# Patient Record
Sex: Female | Born: 1980
Health system: Southern US, Community
[De-identification: ages and names within clinical notes are randomized; demographics above are authoritative.]

## PROBLEM LIST (undated history)

## (undated) DIAGNOSIS — D649 Anemia, unspecified: Secondary | ICD-10-CM

## (undated) DIAGNOSIS — F329 Major depressive disorder, single episode, unspecified: Secondary | ICD-10-CM

## (undated) DIAGNOSIS — R87619 Unspecified abnormal cytological findings in specimens from cervix uteri: Secondary | ICD-10-CM

## (undated) DIAGNOSIS — F32A Depression, unspecified: Secondary | ICD-10-CM

## (undated) HISTORY — DX: Unspecified abnormal cytological findings in specimens from cervix uteri: R87.619

## (undated) HISTORY — PX: TONSILLECTOMY: SUR1361

## (undated) HISTORY — DX: Anemia, unspecified: D64.9

---
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
## 2012-07-10 HISTORY — PX: UMBILICAL HERNIA REPAIR: SHX196

## 2013-06-30 ENCOUNTER — Emergency Department (HOSPITAL_COMMUNITY)
Admission: EM | Admit: 2013-06-30 | Discharge: 2013-07-01 | Disposition: A | Discharge status: Home / Self Care | Payer: Medicare Other | Attending: Emergency Medicine | Admitting: Emergency Medicine

## 2013-06-30 ENCOUNTER — Encounter (HOSPITAL_COMMUNITY): Payer: Self-pay | Admitting: Emergency Medicine

## 2013-06-30 DIAGNOSIS — R109 Unspecified abdominal pain: Secondary | ICD-10-CM

## 2013-06-30 DIAGNOSIS — Z3202 Encounter for pregnancy test, result negative: Secondary | ICD-10-CM | POA: Insufficient documentation

## 2013-06-30 DIAGNOSIS — K420 Umbilical hernia with obstruction, without gangrene: Secondary | ICD-10-CM

## 2013-06-30 DIAGNOSIS — Z8659 Personal history of other mental and behavioral disorders: Secondary | ICD-10-CM | POA: Insufficient documentation

## 2013-06-30 DIAGNOSIS — Z87891 Personal history of nicotine dependence: Secondary | ICD-10-CM | POA: Insufficient documentation

## 2013-06-30 HISTORY — DX: Major depressive disorder, single episode, unspecified: F32.9

## 2013-06-30 HISTORY — DX: Depression, unspecified: F32.A

## 2013-06-30 LAB — CG4 I-STAT (LACTIC ACID): Lactic Acid, Venous: 0.76 mmol/L (ref 0.5–2.2)

## 2013-06-30 LAB — CBC
HEMATOCRIT: 38.3 % (ref 36.0–46.0)
Hemoglobin: 12.8 g/dL (ref 12.0–15.0)
MCH: 28.1 pg (ref 26.0–34.0)
MCHC: 33.4 g/dL (ref 30.0–36.0)
MCV: 84.2 fL (ref 78.0–100.0)
Platelets: 474 10*3/uL — ABNORMAL HIGH (ref 150–400)
RBC: 4.55 MIL/uL (ref 3.87–5.11)
RDW: 14 % (ref 11.5–15.5)
WBC: 9.2 10*3/uL (ref 4.0–10.5)

## 2013-06-30 MED ORDER — ONDANSETRON HCL 4 MG/2ML IJ SOLN
4.0000 mg | Freq: Once | INTRAMUSCULAR | Status: AC
  Administered 2013-06-30: 4 mg via INTRAVENOUS
  Filled 2013-06-30: qty 2

## 2013-06-30 MED ORDER — MORPHINE SULFATE 4 MG/ML IJ SOLN
4.0000 mg | Freq: Once | INTRAMUSCULAR | Status: AC
  Administered 2013-06-30: 4 mg via INTRAVENOUS
  Filled 2013-06-30: qty 1

## 2013-06-30 NOTE — ED Provider Notes (Signed)
CSN: [id number]     Arrival date & time 06/30/13  2052 History   First MD Initiated Contact with Patient 06/30/13 2258     Chief Complaint  Patient presents with  . Abdominal Pain   (Consider location/radiation/quality/duration/timing/severity/associated sxs/prior Treatment) HPI Patient is a generally healthy 33 yo woman with a history of an umbilical hernia. She presents with complaints of inability to reduce her umbilical hernia and periumbilical abdominal pain for the past 4 days. Pain has been moderately severe. It is worse when the patient bends at the waist. She denies N/V, fever. She has had normal BMs - last this morning. PO intake and appetite have been wnl.  Patient notes that the pain radiates to the right side of the abdominal. She notes that she has experienced similar pain in the past but not pain which has extended to the right abdomen. Previous episodes of abdominal pain have been less severe and shorter lived - < 24 h.   Past Medical History  Diagnosis Date  . Depression    Past Surgical History  Procedure Laterality Date  . Tonsillectomy     No family history on file. History  Substance Use Topics  . Smoking status: Former Smoker    Quit date: 06/16/2013  . Smokeless tobacco: Not on file  . Alcohol Use: Yes     Comment: occ   OB History   Grav Para Term Preterm Abortions TAB SAB Ect Mult Living                 Review of Systems Ten point review of symptoms performed and is negative with the exception of symptoms noted above.   Allergies  Review of patient's allergies indicates no known allergies.  Home Medications  No current outpatient prescriptions on file. BP 156/88  Pulse 99  Temp(Src) 97.9 F (36.6 C) (Oral)  Resp 18  Ht 5\' 6"  (1.676 m)  Wt 258 lb 8 oz (117.255 kg)  BMI 41.74 kg/m2  SpO2 100% Physical Exam Gen: well developed and well nourished appearing Head: NCAT Eyes: PERL, EOMI Nose: no epistaixis or rhinorrhea Mouth/throat: mucosa  is moist and pink Neck: supple, no stridor Lungs: CTA B, no wheezing, rhonchi or rales CV: RRR, no murmur, extremities appear well perfused.  Abd: soft, obese, umbilical hernia which extends to the right abdomen is tender and I am not able to reduce, the abdomen is otherwise notender, nondistended Back: no ttp, no cva ttp Skin: warm and dry Ext: normal to inspection, no dependent edema Neuro: CN ii-xii grossly intact, no focal deficits Psyche; normal affect,  calm and cooperative.   ED Course  Procedures (including critical care time)  Results for orders placed during the hospital encounter of 06/30/13 (from the past 24 hour(s))  CBC     Status: Abnormal   Collection Time    06/30/13 11:31 PM      Result Value Range   WBC 9.2  4.0 - 10.5 K/uL   RBC 4.55  3.87 - 5.11 MIL/uL   Hemoglobin 12.8  12.0 - 15.0 g/dL   HCT 40.938.3  81.136.0 - 91.446.0 %   MCV 84.2  78.0 - 100.0 fL   MCH 28.1  26.0 - 34.0 pg   MCHC 33.4  30.0 - 36.0 g/dL   RDW 78.214.0  95.611.5 - 21.315.5 %   Platelets 474 (*) 150 - 400 K/uL  BASIC METABOLIC PANEL     Status: Abnormal   Collection Time    06/30/13 11:31  PM      Result Value Range   Sodium 138  137 - 147 mEq/L   Potassium 4.1  3.7 - 5.3 mEq/L   Chloride 102  96 - 112 mEq/L   CO2 24  19 - 32 mEq/L   Glucose, Bld 82  70 - 99 mg/dL   BUN 7  6 - 23 mg/dL   Creatinine, Ser 8.11  0.50 - 1.10 mg/dL   Calcium 9.5  8.4 - 91.4 mg/dL   GFR calc non Af Amer 76 (*) >90 mL/min   GFR calc Af Amer 89 (*) >90 mL/min  URINALYSIS, ROUTINE W REFLEX MICROSCOPIC     Status: Abnormal   Collection Time    06/30/13 11:41 PM      Result Value Range   Color, Urine YELLOW  YELLOW   APPearance CLOUDY (*) CLEAR   Specific Gravity, Urine 1.020  1.005 - 1.030   pH 8.0  5.0 - 8.0   Glucose, UA NEGATIVE  NEGATIVE mg/dL   Hgb urine dipstick NEGATIVE  NEGATIVE   Bilirubin Urine NEGATIVE  NEGATIVE   Ketones, ur NEGATIVE  NEGATIVE mg/dL   Protein, ur NEGATIVE  NEGATIVE mg/dL   Urobilinogen, UA 0.2   0.0 - 1.0 mg/dL   Nitrite NEGATIVE  NEGATIVE   Leukocytes, UA TRACE (*) NEGATIVE  URINE MICROSCOPIC-ADD ON     Status: Abnormal   Collection Time    06/30/13 11:41 PM      Result Value Range   Squamous Epithelial / LPF FEW (*) RARE   WBC, UA 0-2  <3 WBC/hpf   Bacteria, UA FEW (*) RARE  PREGNANCY, URINE     Status: None   Collection Time    06/30/13 11:41 PM      Result Value Range   Preg Test, Ur NEGATIVE  NEGATIVE  CG4 I-STAT (LACTIC ACID)     Status: None   Collection Time    06/30/13 11:44 PM      Result Value Range   Lactic Acid, Venous 0.76  0.5 - 2.2 mmol/L     MDM  The patient appears to have an incarcerated umblical hernia with omentum more likely than bowel. No signs of sx of obstruction. Labs are reassuring with normal WBC. CT scan for further evaluation. Will attempt to reduce with analgesia.    0210: PROCEDURE NOTE:  HERNIA REDUCTION - umblical hernia successful reduced by me with dilaudid 1mg  IV as analgesia and no sedation. Patient says sx completely resolved with reduction. She is now stable for d/c with plan to f/u with [facility] to be evaluated for elective hernia repair.     Brandt Loosen, MD 07/01/13 [phone]

## 2013-06-30 NOTE — ED Notes (Signed)
Pt states she has had an umbilical hernia for some time, and she and her PCP has been waiting to see if it got better.  Pain for the past four days has been increased

## 2013-07-01 ENCOUNTER — Emergency Department (HOSPITAL_COMMUNITY): Payer: Medicare Other

## 2013-07-01 ENCOUNTER — Encounter (HOSPITAL_COMMUNITY): Payer: Self-pay | Admitting: Radiology

## 2013-07-01 LAB — URINALYSIS, ROUTINE W REFLEX MICROSCOPIC
Bilirubin Urine: NEGATIVE
Glucose, UA: NEGATIVE mg/dL
Hgb urine dipstick: NEGATIVE
KETONES UR: NEGATIVE mg/dL
NITRITE: NEGATIVE
PROTEIN: NEGATIVE mg/dL
Specific Gravity, Urine: 1.02 (ref 1.005–1.030)
Urobilinogen, UA: 0.2 mg/dL (ref 0.0–1.0)
pH: 8 (ref 5.0–8.0)

## 2013-07-01 LAB — BASIC METABOLIC PANEL
BUN: 7 mg/dL (ref 6–23)
CO2: 24 mEq/L (ref 19–32)
Calcium: 9.5 mg/dL (ref 8.4–10.5)
Chloride: 102 mEq/L (ref 96–112)
Creatinine, Ser: 0.97 mg/dL (ref 0.50–1.10)
GFR calc Af Amer: 89 mL/min — ABNORMAL LOW (ref >90)
GFR calc non Af Amer: 76 mL/min — ABNORMAL LOW (ref >90)
Glucose, Bld: 82 mg/dL (ref 70–99)
Potassium: 4.1 mEq/L (ref 3.7–5.3)
SODIUM: 138 meq/L (ref 137–147)

## 2013-07-01 LAB — PREGNANCY, URINE: Preg Test, Ur: NEGATIVE

## 2013-07-01 LAB — URINE MICROSCOPIC-ADD ON

## 2013-07-01 MED ORDER — HYDROMORPHONE HCL PF 1 MG/ML IJ SOLN
1.0000 mg | Freq: Once | INTRAMUSCULAR | Status: AC
  Administered 2013-07-01: 1 mg via INTRAVENOUS
  Filled 2013-07-01: qty 1

## 2013-07-01 MED ORDER — IOHEXOL 300 MG/ML  SOLN
100.0000 mL | Freq: Once | INTRAMUSCULAR | Status: AC | PRN
  Administered 2013-07-01: 100 mL via INTRAVENOUS

## 2014-12-18 ENCOUNTER — Emergency Department (HOSPITAL_COMMUNITY)
Admission: EM | Admit: 2014-12-18 | Discharge: 2014-12-18 | Disposition: A | Discharge status: Home / Self Care | Payer: Medicare Other | Attending: Emergency Medicine | Admitting: Emergency Medicine

## 2014-12-18 ENCOUNTER — Emergency Department (HOSPITAL_COMMUNITY): Payer: Medicare Other

## 2014-12-18 ENCOUNTER — Encounter (HOSPITAL_COMMUNITY): Payer: Self-pay | Admitting: Nurse Practitioner

## 2014-12-18 DIAGNOSIS — Z3A01 Less than 8 weeks gestation of pregnancy: Secondary | ICD-10-CM | POA: Insufficient documentation

## 2014-12-18 DIAGNOSIS — O209 Hemorrhage in early pregnancy, unspecified: Secondary | ICD-10-CM | POA: Insufficient documentation

## 2014-12-18 DIAGNOSIS — R5383 Other fatigue: Secondary | ICD-10-CM | POA: Insufficient documentation

## 2014-12-18 DIAGNOSIS — O9A211 Injury, poisoning and certain other consequences of external causes complicating pregnancy, first trimester: Secondary | ICD-10-CM | POA: Insufficient documentation

## 2014-12-18 DIAGNOSIS — O210 Mild hyperemesis gravidarum: Secondary | ICD-10-CM | POA: Diagnosis not present

## 2014-12-18 DIAGNOSIS — Z87891 Personal history of nicotine dependence: Secondary | ICD-10-CM | POA: Diagnosis not present

## 2014-12-18 DIAGNOSIS — Y998 Other external cause status: Secondary | ICD-10-CM | POA: Insufficient documentation

## 2014-12-18 DIAGNOSIS — Y9389 Activity, other specified: Secondary | ICD-10-CM | POA: Diagnosis not present

## 2014-12-18 DIAGNOSIS — S3991XA Unspecified injury of abdomen, initial encounter: Secondary | ICD-10-CM | POA: Diagnosis not present

## 2014-12-18 DIAGNOSIS — O9989 Other specified diseases and conditions complicating pregnancy, childbirth and the puerperium: Secondary | ICD-10-CM | POA: Diagnosis present

## 2014-12-18 DIAGNOSIS — Y9289 Other specified places as the place of occurrence of the external cause: Secondary | ICD-10-CM | POA: Diagnosis not present

## 2014-12-18 DIAGNOSIS — R102 Pelvic and perineal pain: Secondary | ICD-10-CM

## 2014-12-18 DIAGNOSIS — Z8659 Personal history of other mental and behavioral disorders: Secondary | ICD-10-CM | POA: Diagnosis not present

## 2014-12-18 DIAGNOSIS — Z3490 Encounter for supervision of normal pregnancy, unspecified, unspecified trimester: Secondary | ICD-10-CM

## 2014-12-18 LAB — COMPREHENSIVE METABOLIC PANEL
ALT: 19 U/L (ref 14–54)
ANION GAP: 8 (ref 5–15)
AST: 17 U/L (ref 15–41)
Albumin: 3.5 g/dL (ref 3.5–5.0)
Alkaline Phosphatase: 52 U/L (ref 38–126)
BILIRUBIN TOTAL: 0.2 mg/dL — AB (ref 0.3–1.2)
BUN: 7 mg/dL (ref 6–20)
CO2: 22 mmol/L (ref 22–32)
CREATININE: 0.85 mg/dL (ref 0.44–1.00)
Calcium: 9.2 mg/dL (ref 8.9–10.3)
Chloride: 105 mmol/L (ref 101–111)
GFR calc non Af Amer: >60 mL/min (ref >60)
GLUCOSE: 89 mg/dL (ref 65–99)
Potassium: 4 mmol/L (ref 3.5–5.1)
SODIUM: 135 mmol/L (ref 135–145)
Total Protein: 7.3 g/dL (ref 6.5–8.1)

## 2014-12-18 LAB — URINALYSIS, ROUTINE W REFLEX MICROSCOPIC
Bilirubin Urine: NEGATIVE
Glucose, UA: NEGATIVE mg/dL
HGB URINE DIPSTICK: NEGATIVE
KETONES UR: NEGATIVE mg/dL
LEUKOCYTES UA: NEGATIVE
NITRITE: NEGATIVE
PH: 6.5 (ref 5.0–8.0)
PROTEIN: NEGATIVE mg/dL
Specific Gravity, Urine: 1.022 (ref 1.005–1.030)
UROBILINOGEN UA: 1 mg/dL (ref 0.0–1.0)

## 2014-12-18 LAB — I-STAT BETA HCG BLOOD, ED (MC, WL, AP ONLY)

## 2014-12-18 LAB — LIPASE, BLOOD: Lipase: 18 U/L — ABNORMAL LOW (ref 22–51)

## 2014-12-18 LAB — WET PREP, GENITAL
Clue Cells Wet Prep HPF POC: NONE SEEN
Trich, Wet Prep: NONE SEEN
Yeast Wet Prep HPF POC: NONE SEEN

## 2014-12-18 LAB — CBC
HCT: 38.2 % (ref 36.0–46.0)
Hemoglobin: 12.8 g/dL (ref 12.0–15.0)
MCH: 27.8 pg (ref 26.0–34.0)
MCHC: 33.5 g/dL (ref 30.0–36.0)
MCV: 83 fL (ref 78.0–100.0)
Platelets: 384 10*3/uL (ref 150–400)
RBC: 4.6 MIL/uL (ref 3.87–5.11)
RDW: 14.8 % (ref 11.5–15.5)
WBC: 9 10*3/uL (ref 4.0–10.5)

## 2014-12-18 LAB — HCG, QUANTITATIVE, PREGNANCY: hCG, Beta Chain, Quant, S: 53185 m[IU]/mL — ABNORMAL HIGH (ref <5)

## 2014-12-18 MED ORDER — METOCLOPRAMIDE HCL 10 MG PO TABS: 10.0000 mg | ORAL_TABLET | Freq: Four times a day (QID) | ORAL | Status: DC

## 2014-12-18 MED ORDER — PRENATAL COMPLETE 14-0.4 MG PO TABS: ORAL_TABLET | ORAL | Status: DC
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
## 2014-12-18 NOTE — Discharge Instructions (Signed)
Abdominal Pain During Pregnancy °Abdominal pain is common in pregnancy. Most of the time, it does not cause harm. There are many causes of abdominal pain. Some causes are more serious than others. Some of the causes of abdominal pain in pregnancy are easily diagnosed. Occasionally, the diagnosis takes time to understand. Other times, the cause is not determined. Abdominal pain can be a sign that something is very wrong with the pregnancy, or the pain may have nothing to do with the pregnancy at all. For this reason, always tell your health care provider if you have any abdominal discomfort. °HOME CARE INSTRUCTIONS  °Monitor your abdominal pain for any changes. The following actions may help to alleviate any discomfort you are experiencing: °· Do not have sexual intercourse or put anything in your vagina until your symptoms go away completely. °· Get plenty of rest until your pain improves. °· Drink clear fluids if you feel nauseous. Avoid solid food as long as you are uncomfortable or nauseous. °· Only take over-the-counter or prescription medicine as directed by your health care provider. °· Keep all follow-up appointments with your health care provider. °SEEK IMMEDIATE MEDICAL CARE IF: °· You are bleeding, leaking fluid, or passing tissue from the vagina. °· You have increasing pain or cramping. °· You have persistent vomiting. °· You have painful or bloody urination. °· You have a fever. °· You notice a decrease in your baby's movements. °· You have extreme weakness or feel faint. °· You have shortness of breath, with or without abdominal pain. °· You develop a severe headache with abdominal pain. °· You have abnormal vaginal discharge with abdominal pain. °· You have persistent diarrhea. °· You have abdominal pain that continues even after rest, or gets worse. °MAKE SURE YOU:  °· Understand these instructions. °· Will watch your condition. °· Will get help right away if you are not doing well or get  worse. °Document Released: 05/26/2005 Document Revised: 03/16/2013 Document Reviewed: 12/23/2012 °Exit[employer]® Patient Information ©2015 [employer]. This information is not intended to replace advice given to you by your health care provider. Make sure you discuss any questions you have with your health care provider. °First Trimester of Pregnancy °The first trimester of pregnancy is from week 1 until the end of week 12 (months 1 through 3). A week after a sperm fertilizes an egg, the egg will implant on the wall of the uterus. This embryo will begin to develop into a baby. Genes from you and your partner are forming the baby. The female genes determine whether the baby is a boy or a girl. At 6-8 weeks, the eyes and face are formed, and the heartbeat can be seen on ultrasound. At the end of 12 weeks, all the baby's organs are formed.  °Now that you are pregnant, you will want to do everything you can to have a healthy baby. Two of the most important things are to get good prenatal care and to follow your health care provider's instructions. Prenatal care is all the medical care you receive before the baby's birth. This care will help prevent, find, and treat any problems during the pregnancy and childbirth. °BODY CHANGES °Your body goes through many changes during pregnancy. The changes vary from woman to woman.  °· You may gain or lose a couple of pounds at first. °· You may feel sick to your stomach (nauseous) and throw up (vomit). If the vomiting is uncontrollable, call your health care provider. °· You may tire easily. °·   You may develop headaches that can be relieved by medicines approved by your health care provider. °· You may urinate more often. Painful urination may mean you have a bladder infection. °· You may develop heartburn as a result of your pregnancy. °· You may develop constipation because certain hormones are causing the muscles that push waste through your intestines to slow down. °· You may  develop hemorrhoids or swollen, bulging veins (varicose veins). °· Your breasts may begin to grow larger and become tender. Your nipples may stick out more, and the tissue that surrounds them (areola) may become darker. °· Your gums may bleed and may be sensitive to brushing and flossing. °· Dark spots or blotches (chloasma, mask of pregnancy) may develop on your face. This will likely fade after the baby is born. °· Your menstrual periods will stop. °· You may have a loss of appetite. °· You may develop cravings for certain kinds of food. °· You may have changes in your emotions from day to day, such as being excited to be pregnant or being concerned that something may go wrong with the pregnancy and baby. °· You may have more vivid and strange dreams. °· You may have changes in your hair. These can include thickening of your hair, rapid growth, and changes in texture. Some women also have hair loss during or after pregnancy, or hair that feels dry or thin. Your hair will most likely return to normal after your baby is born. °WHAT TO EXPECT AT YOUR PRENATAL VISITS °During a routine prenatal visit: °· You will be weighed to make sure you and the baby are growing normally. °· Your blood pressure will be taken. °· Your abdomen will be measured to track your baby's growth. °· The fetal heartbeat will be listened to starting around week 10 or 12 of your pregnancy. °· Test results from any previous visits will be discussed. °Your health care provider may ask you: °· How you are feeling. °· If you are feeling the baby move. °· If you have had any abnormal symptoms, such as leaking fluid, bleeding, severe headaches, or abdominal cramping. °· If you have any questions. °Other tests that may be performed during your first trimester include: °· Blood tests to find your blood type and to check for the presence of any previous infections. They will also be used to check for low iron levels (anemia) and Rh antibodies. Later in  the pregnancy, blood tests for diabetes will be done along with other tests if problems develop. °· Urine tests to check for infections, diabetes, or protein in the urine. °· An ultrasound to confirm the proper growth and development of the baby. °· An amniocentesis to check for possible genetic problems. °· Fetal screens for spina bifida and Down syndrome. °· You may need other tests to make sure you and the baby are doing well. °HOME CARE INSTRUCTIONS  °Medicines °· Follow your health care provider's instructions regarding medicine use. Specific medicines may be either safe or unsafe to take during pregnancy. °· Take your prenatal vitamins as directed. °· If you develop constipation, try taking a stool softener if your health care provider approves. °Diet °· Eat regular, well-balanced meals. Choose a variety of foods, such as meat or vegetable-based protein, fish, milk and low-fat dairy products, vegetables, fruits, and whole grain breads and cereals. Your health care provider will help you determine the amount of weight gain that is right for you. °· Avoid raw meat and uncooked cheese. These   carry germs that can cause birth defects in the baby. °· Eating four or five small meals rather than three large meals a day may help relieve nausea and vomiting. If you start to feel nauseous, eating a few soda crackers can be helpful. Drinking liquids between meals instead of during meals also seems to help nausea and vomiting. °· If you develop constipation, eat more high-fiber foods, such as fresh vegetables or fruit and whole grains. Drink enough fluids to keep your urine clear or pale yellow. °Activity and Exercise °· Exercise only as directed by your health care provider. Exercising will help you: °¨ Control your weight. °¨ Stay in shape. °¨ Be prepared for labor and delivery. °· Experiencing pain or cramping in the lower abdomen or low back is a good sign that you should stop exercising. Check with your health care  provider before continuing normal exercises. °· Try to avoid standing for long periods of time. Move your legs often if you must stand in one place for a long time. °· Avoid heavy lifting. °· Wear low-heeled shoes, and practice good posture. °· You may continue to have sex unless your health care provider directs you otherwise. °Relief of Pain or Discomfort °· Wear a good support bra for breast tenderness.   °· Take warm sitz baths to soothe any pain or discomfort caused by hemorrhoids. Use hemorrhoid cream if your health care provider approves.   °· Rest with your legs elevated if you have leg cramps or low back pain. °· If you develop varicose veins in your legs, wear support hose. Elevate your feet for 15 minutes, 3-4 times a day. Limit salt in your diet. °Prenatal Care °· Schedule your prenatal visits by the twelfth week of pregnancy. They are usually scheduled monthly at first, then more often in the last 2 months before delivery. °· Write down your questions. Take them to your prenatal visits. °· Keep all your prenatal visits as directed by your health care provider. °Safety °· Wear your seat belt at all times when driving. °· Make a list of emergency phone numbers, including numbers for family, friends, the hospital, and police and fire departments. °General Tips °· Ask your health care provider for a referral to a local prenatal education class. Begin classes no later than at the beginning of month 6 of your pregnancy. °· Ask for help if you have counseling or nutritional needs during pregnancy. Your health care provider can offer advice or refer you to specialists for help with various needs. °· Do not use hot tubs, steam rooms, or saunas. °· Do not douche or use tampons or scented sanitary pads. °· Do not cross your legs for long periods of time. °· Avoid cat litter boxes and soil used by cats. These carry germs that can cause birth defects in the baby and possibly loss of the fetus by miscarriage or  stillbirth. °· Avoid all smoking, herbs, alcohol, and medicines not prescribed by your health care provider. Chemicals in these affect the formation and growth of the baby. °· Schedule a dentist appointment. At home, brush your teeth with a soft toothbrush and be gentle when you floss. °SEEK MEDICAL CARE IF:  °· You have dizziness. °· You have mild pelvic cramps, pelvic pressure, or nagging pain in the abdominal area. °· You have persistent nausea, vomiting, or diarrhea. °· You have a bad smelling vaginal discharge. °· You have pain with urination. °· You notice increased swelling in your face, hands, legs, or ankles. °SEEK   IMMEDIATE MEDICAL CARE IF:  °· You have a fever. °· You are leaking fluid from your vagina. °· You have spotting or bleeding from your vagina. °· You have severe abdominal cramping or pain. °· You have rapid weight gain or loss. °· You vomit blood or material that looks like coffee grounds. °· You are exposed to German measles and have never had them. °· You are exposed to fifth disease or chickenpox. °· You develop a severe headache. °· You have shortness of breath. °· You have any kind of trauma, such as from a fall or a car accident. °Document Released: 05/20/2001 Document Revised: 10/10/2013 Document Reviewed: 04/05/2013 °ExitCare® Patient Information ©2015 [employer]. This information is not intended to replace advice given to you by your health care provider. Make sure you discuss any questions you have with your health care provider. ° °
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
## 2014-12-19 LAB — GC/CHLAMYDIA PROBE AMP (~~LOC~~) NOT AT ARMC
Chlamydia: NEGATIVE
Neisseria Gonorrhea: NEGATIVE

## 2015-01-04 ENCOUNTER — Ambulatory Visit: Payer: Medicaid Other | Admitting: Obstetrics and Gynecology

## 2015-01-16 ENCOUNTER — Ambulatory Visit (INDEPENDENT_AMBULATORY_CARE_PROVIDER_SITE_OTHER): Payer: Medicare Other

## 2015-01-16 VITALS — BP 117/87 | HR 73 | Wt 245.6 lb

## 2015-01-16 DIAGNOSIS — Z36 Encounter for antenatal screening of mother: Secondary | ICD-10-CM

## 2015-01-16 DIAGNOSIS — Z1389 Encounter for screening for other disorder: Secondary | ICD-10-CM

## 2015-01-16 DIAGNOSIS — Z113 Encounter for screening for infections with a predominantly sexual mode of transmission: Secondary | ICD-10-CM

## 2015-01-16 DIAGNOSIS — Z369 Encounter for antenatal screening, unspecified: Secondary | ICD-10-CM

## 2015-01-16 DIAGNOSIS — R638 Other symptoms and signs concerning food and fluid intake: Secondary | ICD-10-CM

## 2015-01-16 DIAGNOSIS — Z331 Pregnant state, incidental: Secondary | ICD-10-CM

## 2015-01-16 MED ORDER — DOXYLAMINE-PYRIDOXINE 10-10 MG PO TBEC: DELAYED_RELEASE_TABLET | ORAL | Status: DC

## 2015-01-16 NOTE — Progress Notes (Signed)
Lori Hahn for NOB nurse interview visit. G-8.   [id number].  Confirmed at [facility]. Ultrsound done on 12/18/2014.  Pregnancy eduction material explained and given. No cats in the home. NOB labs ordered. TSH/HbgA1c due to Increased BMI, HIV consent form signed. Drug screen ordered and sent to lab. PNV encouraged. NT ordered. Pt. To follow up with provider in 2 weeks for NOB physical.  All questions answered.  ZIKA EXPOSURE SCREEN:  The patient has not traveled to a [place] Virus endemic area within the past 6 months, nor has she had unprotected sex with a partner who has travelled to a [place] endemic region within the past 6 months. The patient has been advised to notify us if these factors change any time during this current pregnancy, so adequate testing and monitoring can be initiated.

## 2015-01-17 ENCOUNTER — Other Ambulatory Visit: Payer: Self-pay | Admitting: Obstetrics and Gynecology

## 2015-01-17 DIAGNOSIS — O9989 Other specified diseases and conditions complicating pregnancy, childbirth and the puerperium: Principal | ICD-10-CM

## 2015-01-17 DIAGNOSIS — O09899 Supervision of other high risk pregnancies, unspecified trimester: Secondary | ICD-10-CM

## 2015-01-17 DIAGNOSIS — Z283 Underimmunization status: Secondary | ICD-10-CM

## 2015-01-17 DIAGNOSIS — Z2839 Other underimmunization status: Secondary | ICD-10-CM

## 2015-01-17 LAB — CBC WITH DIFFERENTIAL/PLATELET
BASOS ABS: 0 10*3/uL (ref 0.0–0.2)
Basos: 0 %
EOS (ABSOLUTE): 0.1 10*3/uL (ref 0.0–0.4)
Eos: 1 %
Hematocrit: 40.5 % (ref 34.0–46.6)
Hemoglobin: 13.6 g/dL (ref 11.1–15.9)
IMMATURE GRANULOCYTES: 0 %
Immature Grans (Abs): 0 10*3/uL (ref 0.0–0.1)
Lymphocytes Absolute: 2.9 10*3/uL (ref 0.7–3.1)
Lymphs: 34 %
MCH: 27.8 pg (ref 26.6–33.0)
MCHC: 33.6 g/dL (ref 31.5–35.7)
MCV: 83 fL (ref 79–97)
Monocytes Absolute: 0.6 10*3/uL (ref 0.1–0.9)
Monocytes: 7 %
NEUTROS PCT: 58 %
Neutrophils Absolute: 5 10*3/uL (ref 1.4–7.0)
PLATELETS: 454 10*3/uL — AB (ref 150–379)
RBC: 4.9 x10E6/uL (ref 3.77–5.28)
RDW: 14.9 % (ref 12.3–15.4)
WBC: 8.6 10*3/uL (ref 3.4–10.8)

## 2015-01-17 LAB — URINALYSIS, ROUTINE W REFLEX MICROSCOPIC
BILIRUBIN UA: NEGATIVE
Glucose, UA: NEGATIVE
Ketones, UA: NEGATIVE
Leukocytes, UA: NEGATIVE
NITRITE UA: NEGATIVE
PH UA: 6 (ref 5.0–7.5)
Protein, UA: NEGATIVE
RBC, UA: NEGATIVE
SPEC GRAV UA: 1.022 (ref 1.005–1.030)
UUROB: 0.2 mg/dL (ref 0.2–1.0)

## 2015-01-17 LAB — VARICELLA ZOSTER ANTIBODY, IGM: Varicella IgM: <0.91 index (ref 0.00–0.90)

## 2015-01-17 LAB — RH TYPE: Rh Factor: POSITIVE

## 2015-01-17 LAB — RUBELLA ANTIBODY, IGM: Rubella IgM: <20 AU/mL (ref 0.0–19.9)

## 2015-01-17 LAB — HEMOGLOBIN A1C
ESTIMATED AVERAGE GLUCOSE: 114 mg/dL
HEMOGLOBIN A1C: 5.6 % (ref 4.8–5.6)

## 2015-01-17 LAB — SICKLE CELL SCREEN: SICKLE CELL SCREEN: NEGATIVE

## 2015-01-17 LAB — TSH: TSH: 1.32 u[IU]/mL (ref 0.450–4.500)

## 2015-01-17 LAB — HIV ANTIBODY (ROUTINE TESTING W REFLEX): HIV Screen 4th Generation wRfx: NONREACTIVE

## 2015-01-17 LAB — RPR: RPR: NONREACTIVE

## 2015-01-17 LAB — ANTIBODY SCREEN: Antibody Screen: NEGATIVE

## 2015-01-17 LAB — ABO

## 2015-01-17 LAB — HEPATITIS B SURFACE ANTIGEN: Hepatitis B Surface Ag: NEGATIVE

## 2015-01-18 LAB — GC/CHLAMYDIA PROBE AMP
Chlamydia trachomatis, NAA: NEGATIVE
Neisseria gonorrhoeae by PCR: NEGATIVE

## 2015-01-18 LAB — URINE CULTURE: Organism ID, Bacteria: NO GROWTH

## 2015-01-24 ENCOUNTER — Other Ambulatory Visit: Payer: Self-pay | Admitting: Obstetrics and Gynecology

## 2015-01-24 ENCOUNTER — Other Ambulatory Visit: Payer: Medicare Other

## 2015-01-24 DIAGNOSIS — Z369 Encounter for antenatal screening, unspecified: Secondary | ICD-10-CM

## 2015-01-24 DIAGNOSIS — Z36 Encounter for antenatal screening of mother: Secondary | ICD-10-CM | POA: Diagnosis not present

## 2015-01-24 DIAGNOSIS — Z331 Pregnant state, incidental: Secondary | ICD-10-CM | POA: Diagnosis not present

## 2015-01-25 ENCOUNTER — Other Ambulatory Visit: Payer: Self-pay | Admitting: Obstetrics and Gynecology

## 2015-01-25 ENCOUNTER — Encounter: Payer: Self-pay | Admitting: Obstetrics and Gynecology

## 2015-01-25 DIAGNOSIS — F1290 Cannabis use, unspecified, uncomplicated: Secondary | ICD-10-CM | POA: Insufficient documentation

## 2015-01-26 LAB — FIRST TRIMESTER SCREEN W/NT
CRL: 66.1 mm
DIA MoM: 1.53
DIA VALUE: 266.3 pg/mL
GEST AGE-COLLECT: 12.7 wk
HCG MOM: 1.89
HCG VALUE: 126.5 [IU]/mL
Maternal Age At EDD: 34.6 years
NUCHAL TRANSLUCENCY MOM: 1.24
Nuchal Translucency: 1.8 mm
Number of Fetuses: 1
PAPP-A MoM: 1.54
PAPP-A Value: 888.8 ng/mL
TEST RESULTS: NEGATIVE
WEIGHT: 245 [lb_av]

## 2015-01-31 ENCOUNTER — Telehealth: Payer: Self-pay | Admitting: *Deleted

## 2015-01-31 NOTE — Telephone Encounter (Signed)
-----   Message from Ulyses Amor, [place] sent at 01/30/2015  3:51 PM EDT ----- LM for her to call for results- NT was negative for increased risk- OK to let her know

## 2015-01-31 NOTE — Telephone Encounter (Signed)
Notified pt of results she voiced understanding 

## 2015-02-01 ENCOUNTER — Encounter: Payer: Medicare Other | Admitting: Obstetrics and Gynecology

## 2015-03-27 ENCOUNTER — Encounter (HOSPITAL_COMMUNITY): Payer: Self-pay | Admitting: *Deleted

## 2015-03-27 ENCOUNTER — Emergency Department (HOSPITAL_COMMUNITY)
Admission: EM | Admit: 2015-03-27 | Discharge: 2015-03-27 | Disposition: A | Discharge status: Home / Self Care | Payer: Medicare Other | Attending: Emergency Medicine | Admitting: Emergency Medicine

## 2015-03-27 DIAGNOSIS — Y9389 Activity, other specified: Secondary | ICD-10-CM | POA: Diagnosis not present

## 2015-03-27 DIAGNOSIS — S3992XA Unspecified injury of lower back, initial encounter: Secondary | ICD-10-CM | POA: Diagnosis present

## 2015-03-27 DIAGNOSIS — Z79899 Other long term (current) drug therapy: Secondary | ICD-10-CM | POA: Insufficient documentation

## 2015-03-27 DIAGNOSIS — Y998 Other external cause status: Secondary | ICD-10-CM | POA: Diagnosis not present

## 2015-03-27 DIAGNOSIS — D649 Anemia, unspecified: Secondary | ICD-10-CM | POA: Diagnosis not present

## 2015-03-27 DIAGNOSIS — Z041 Encounter for examination and observation following transport accident: Secondary | ICD-10-CM

## 2015-03-27 DIAGNOSIS — Z87891 Personal history of nicotine dependence: Secondary | ICD-10-CM | POA: Insufficient documentation

## 2015-03-27 DIAGNOSIS — F329 Major depressive disorder, single episode, unspecified: Secondary | ICD-10-CM | POA: Diagnosis not present

## 2015-03-27 DIAGNOSIS — M545 Low back pain, unspecified: Secondary | ICD-10-CM

## 2015-03-27 DIAGNOSIS — Y92410 Unspecified street and highway as the place of occurrence of the external cause: Secondary | ICD-10-CM | POA: Diagnosis not present

## 2015-03-27 DIAGNOSIS — Z043 Encounter for examination and observation following other accident: Secondary | ICD-10-CM

## 2015-03-27 MED ORDER — TRAMADOL HCL 50 MG PO TABS: 50.0000 mg | ORAL_TABLET | Freq: Four times a day (QID) | ORAL | Status: DC | PRN

## 2015-03-27 MED ORDER — IBUPROFEN 800 MG PO TABS: 800.0000 mg | ORAL_TABLET | Freq: Three times a day (TID) | ORAL | Status: DC

## 2015-03-27 MED ORDER — CYCLOBENZAPRINE HCL 10 MG PO TABS: 10.0000 mg | ORAL_TABLET | Freq: Two times a day (BID) | ORAL | Status: DC | PRN
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
## 2016-07-24 ENCOUNTER — Emergency Department (HOSPITAL_COMMUNITY)
Admission: EM | Admit: 2016-07-24 | Discharge: 2016-07-24 | Disposition: A | Discharge status: Home / Self Care | Payer: Commercial Managed Care - HMO | Attending: Emergency Medicine | Admitting: Emergency Medicine

## 2016-07-24 ENCOUNTER — Encounter (HOSPITAL_COMMUNITY): Payer: Self-pay

## 2016-07-24 ENCOUNTER — Emergency Department (HOSPITAL_COMMUNITY): Payer: Commercial Managed Care - HMO

## 2016-07-24 DIAGNOSIS — W208XXA Other cause of strike by thrown, projected or falling object, initial encounter: Secondary | ICD-10-CM | POA: Insufficient documentation

## 2016-07-24 DIAGNOSIS — Y9289 Other specified places as the place of occurrence of the external cause: Secondary | ICD-10-CM | POA: Diagnosis not present

## 2016-07-24 DIAGNOSIS — Y999 Unspecified external cause status: Secondary | ICD-10-CM | POA: Diagnosis not present

## 2016-07-24 DIAGNOSIS — Z87891 Personal history of nicotine dependence: Secondary | ICD-10-CM | POA: Diagnosis not present

## 2016-07-24 DIAGNOSIS — S91105A Unspecified open wound of left lesser toe(s) without damage to nail, initial encounter: Secondary | ICD-10-CM | POA: Diagnosis not present

## 2016-07-24 DIAGNOSIS — Y9389 Activity, other specified: Secondary | ICD-10-CM | POA: Diagnosis not present

## 2016-07-24 DIAGNOSIS — S91319A Laceration without foreign body, unspecified foot, initial encounter: Secondary | ICD-10-CM

## 2016-07-24 DIAGNOSIS — S92535A Nondisplaced fracture of distal phalanx of left lesser toe(s), initial encounter for closed fracture: Secondary | ICD-10-CM | POA: Insufficient documentation

## 2016-07-24 DIAGNOSIS — M79672 Pain in left foot: Secondary | ICD-10-CM | POA: Diagnosis not present

## 2016-07-24 DIAGNOSIS — S99922A Unspecified injury of left foot, initial encounter: Secondary | ICD-10-CM | POA: Diagnosis not present

## 2016-07-24 MED ORDER — MELOXICAM 15 MG PO TABS: 15.0000 mg | ORAL_TABLET | Freq: Every day | ORAL | 0 refills | Status: DC

## 2016-07-24 MED ORDER — TETANUS-DIPHTH-ACELL PERTUSSIS 5-2.5-18.5 LF-MCG/0.5 IM SUSP
0.5000 mL | Freq: Once | INTRAMUSCULAR | Status: DC
  Filled 2016-07-24: qty 0.5

## 2016-07-24 MED ORDER — IBUPROFEN 800 MG PO TABS
800.0000 mg | ORAL_TABLET | Freq: Once | ORAL | Status: AC
  Administered 2016-07-24: 800 mg via ORAL
  Filled 2016-07-24: qty 1

## 2016-07-24 NOTE — ED Triage Notes (Signed)
Pt states heater fell onto L 5th toe. Pt with small lac to L 5th toe. Pt states some swelling and redness. No bleeding at triage.

## 2016-07-24 NOTE — Progress Notes (Signed)
Orthopedic Tech Progress Note Patient Details:  Lori Boschiffany Malicki 1980-10-02 [id number]  Ortho Devices Type of Ortho Device: Crutches Ortho Device/Splint Interventions: Application   Lori FordyceJennifer C Adrain Hahn 07/24/2016, 7:58 AM
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
## 2016-07-24 NOTE — Discharge Instructions (Signed)
Keep your wound clean and bandaged. Return if your you have any signs of infection.

## 2016-08-28 ENCOUNTER — Other Ambulatory Visit: Payer: Self-pay

## 2016-08-28 ENCOUNTER — Other Ambulatory Visit (HOSPITAL_COMMUNITY)
Admission: RE | Admit: 2016-08-28 | Discharge: 2016-08-28 | Disposition: A | Discharge status: Home / Self Care | Payer: Medicare HMO | Source: Ambulatory Visit | Attending: Pediatrics | Admitting: Pediatrics

## 2016-08-28 DIAGNOSIS — F17210 Nicotine dependence, cigarettes, uncomplicated: Secondary | ICD-10-CM | POA: Diagnosis not present

## 2016-08-28 DIAGNOSIS — Z0000 Encounter for general adult medical examination without abnormal findings: Secondary | ICD-10-CM | POA: Diagnosis not present

## 2016-08-28 DIAGNOSIS — Z01411 Encounter for gynecological examination (general) (routine) with abnormal findings: Secondary | ICD-10-CM | POA: Diagnosis not present

## 2016-08-28 DIAGNOSIS — N9089 Other specified noninflammatory disorders of vulva and perineum: Secondary | ICD-10-CM | POA: Diagnosis not present

## 2016-08-28 DIAGNOSIS — Z0001 Encounter for general adult medical examination with abnormal findings: Secondary | ICD-10-CM | POA: Diagnosis not present

## 2016-08-28 DIAGNOSIS — F3130 Bipolar disorder, current episode depressed, mild or moderate severity, unspecified: Secondary | ICD-10-CM | POA: Diagnosis not present

## 2016-08-28 DIAGNOSIS — Z124 Encounter for screening for malignant neoplasm of cervix: Secondary | ICD-10-CM | POA: Diagnosis not present

## 2016-09-02 LAB — CYTOLOGY - PAP

## 2016-09-17 DIAGNOSIS — Z0001 Encounter for general adult medical examination with abnormal findings: Secondary | ICD-10-CM | POA: Diagnosis not present

## 2016-09-17 DIAGNOSIS — F314 Bipolar disorder, current episode depressed, severe, without psychotic features: Secondary | ICD-10-CM | POA: Diagnosis not present

## 2016-09-18 DIAGNOSIS — N907 Vulvar cyst: Secondary | ICD-10-CM | POA: Diagnosis not present

## 2016-09-18 DIAGNOSIS — A609 Anogenital herpesviral infection, unspecified: Secondary | ICD-10-CM | POA: Diagnosis not present

## 2016-09-22 ENCOUNTER — Telehealth: Payer: Self-pay | Admitting: General Practice

## 2016-09-22 NOTE — Telephone Encounter (Signed)
I called the patient to confirm whether or not she has a PCP.  There was no answer and no option to leave a voicemail message. [place] ([facility])

## 2016-09-26 NOTE — Telephone Encounter (Signed)
I spoke with the patient's mother who was not able to provide me with any information about her daughter. Lori Hahn ([facility])

## 2016-10-31 DIAGNOSIS — F314 Bipolar disorder, current episode depressed, severe, without psychotic features: Secondary | ICD-10-CM | POA: Diagnosis not present

## 2018-01-15 ENCOUNTER — Emergency Department (HOSPITAL_COMMUNITY)
Admission: EM | Admit: 2018-01-15 | Discharge: 2018-01-16 | Disposition: A | Discharge status: Home / Self Care | Payer: Medicare HMO | Attending: Emergency Medicine | Admitting: Emergency Medicine

## 2018-01-15 ENCOUNTER — Other Ambulatory Visit: Payer: Self-pay

## 2018-01-15 DIAGNOSIS — Z79899 Other long term (current) drug therapy: Secondary | ICD-10-CM | POA: Diagnosis not present

## 2018-01-15 DIAGNOSIS — R61 Generalized hyperhidrosis: Secondary | ICD-10-CM | POA: Insufficient documentation

## 2018-01-15 DIAGNOSIS — R0600 Dyspnea, unspecified: Secondary | ICD-10-CM | POA: Diagnosis not present

## 2018-01-15 DIAGNOSIS — Z87891 Personal history of nicotine dependence: Secondary | ICD-10-CM | POA: Diagnosis not present

## 2018-01-15 DIAGNOSIS — R0789 Other chest pain: Secondary | ICD-10-CM | POA: Insufficient documentation

## 2018-01-16 ENCOUNTER — Emergency Department (HOSPITAL_COMMUNITY): Payer: Medicare HMO

## 2018-01-16 ENCOUNTER — Other Ambulatory Visit: Payer: Self-pay

## 2018-01-16 DIAGNOSIS — R0789 Other chest pain: Secondary | ICD-10-CM | POA: Diagnosis not present

## 2018-01-16 LAB — BASIC METABOLIC PANEL
Anion gap: 8 (ref 5–15)
BUN: 14 mg/dL (ref 6–20)
CO2: 23 mmol/L (ref 22–32)
CREATININE: 1.01 mg/dL — AB (ref 0.44–1.00)
Calcium: 9 mg/dL (ref 8.9–10.3)
Chloride: 105 mmol/L (ref 98–111)
GFR calc Af Amer: >60 mL/min (ref >60)
GFR calc non Af Amer: >60 mL/min (ref >60)
GLUCOSE: 87 mg/dL (ref 70–99)
Potassium: 3.7 mmol/L (ref 3.5–5.1)
Sodium: 136 mmol/L (ref 135–145)

## 2018-01-16 LAB — I-STAT TROPONIN, ED
Troponin i, poc: 0 ng/mL (ref 0.00–0.08)
Troponin i, poc: 0.01 ng/mL (ref 0.00–0.08)

## 2018-01-16 LAB — I-STAT BETA HCG BLOOD, ED (MC, WL, AP ONLY): I-stat hCG, quantitative: <5 m[IU]/mL (ref <5)

## 2018-01-16 LAB — CBC
HCT: 41 % (ref 36.0–46.0)
Hemoglobin: 12.9 g/dL (ref 12.0–15.0)
MCH: 28.4 pg (ref 26.0–34.0)
MCHC: 31.5 g/dL (ref 30.0–36.0)
MCV: 90.3 fL (ref 78.0–100.0)
PLATELETS: 468 10*3/uL — AB (ref 150–400)
RBC: 4.54 MIL/uL (ref 3.87–5.11)
RDW: 14.6 % (ref 11.5–15.5)
WBC: 11.3 10*3/uL — ABNORMAL HIGH (ref 4.0–10.5)

## 2018-01-16 MED ORDER — GI COCKTAIL ~~LOC~~
30.0000 mL | Freq: Once | ORAL | Status: AC
  Administered 2018-01-16: 30 mL via ORAL
  Filled 2018-01-16: qty 30

## 2018-01-16 MED ORDER — LAMOTRIGINE 25 MG PO TABS: 25.0000 mg | ORAL_TABLET | Freq: Every day | ORAL | 0 refills | Status: DC

## 2018-01-16 MED ORDER — PANTOPRAZOLE SODIUM 40 MG PO TBEC: 40.0000 mg | DELAYED_RELEASE_TABLET | Freq: Every day | ORAL | 0 refills | Status: DC

## 2018-01-16 MED ORDER — LURASIDONE HCL 40 MG PO TABS: 40.0000 mg | ORAL_TABLET | Freq: Every day | ORAL | 0 refills | Status: DC

## 2018-01-16 MED ORDER — LORAZEPAM 1 MG PO TABS: 1.0000 mg | ORAL_TABLET | Freq: Three times a day (TID) | ORAL | 0 refills | Status: DC | PRN

## 2018-01-16 NOTE — ED Provider Notes (Signed)
[facility] EMERGENCY DEPARTMENT Provider Note   CSN: [id number] Arrival date & time: 01/15/18  2353     History   Chief Complaint Chief Complaint  Patient presents with  . Chest Pain    HPI Lori Hahn is a 37 y.o. female.  The history is provided by the patient.  She had onset about 8 PM of a tight feeling in her chest without radiation.  There was mild associated dyspnea and diaphoresis but no nausea.  Symptoms were not affected by exertion or body position.  She cannot recall having had symptoms like this before.  She does smoke half pack of cigarettes a day.  She denies history of hypertension, diabetes, hyperlipidemia.  She is adopted, so does not know her family history.  She rates her discomfort at 7/10.  She denies history of DVT, pulmonary embolism, recent travel, recent surgery, use of exogenous estrogens.  Past Medical History:  Diagnosis Date  . Abnormal Pap smear of cervix    has colpo  . Anemia   . Depression     Patient Active Problem List   Diagnosis Date Noted  . Marijuana smoker 01/25/2015    Past Surgical History:  Procedure Laterality Date  . COLPOSCOPY  2001   dysplasia  . TONSILLECTOMY    . UMBILICAL HERNIA REPAIR  07/2012     OB History    Gravida  8   Para  5   Term  5   Preterm      AB  2   Living  5     SAB  2   TAB      Ectopic      Multiple      Live Births  5        Obstetric Comments  Blighted ovum-2003         Home Medications    Prior to Admission medications   Medication Sig Start Date End Date Taking? Authorizing Provider  cyclobenzaprine (FLEXERIL) 10 MG tablet Take 1 tablet (10 mg total) by mouth 2 (two) times daily as needed for muscle spasms. Patient not taking: Reported on 01/16/2018 03/27/15   Danelle Berry, PA-C  Doxylamine-Pyridoxine 10-10 MG TBEC Day 1 & 2: Take 2 tablets at bedtime on an empty stomach. Day 3: If symptoms persist take 1 tablet in the morning and 2 tables at  bedtime. Day 4: If symptoms persist take 1 tablet in the morning, 1 tablet mid-afternoon and 2 tablets at bedtime. Max Dose: 4 tablets a day. Pregnancy Category A Patient not taking: Reported on 01/16/2018 01/16/15   Shambley, Melody N, CNM  ibuprofen (ADVIL,MOTRIN) 800 MG tablet Take 1 tablet (800 mg total) by mouth 3 (three) times daily. Patient not taking: Reported on 01/16/2018 03/27/15   Danelle Berry, PA-C  meloxicam (MOBIC) 15 MG tablet Take 1 tablet (15 mg total) by mouth daily. Take 1 daily with food. Patient not taking: Reported on 01/16/2018 07/24/16   Arthor Captain, PA-C  metoCLOPramide (REGLAN) 10 MG tablet Take 1 tablet (10 mg total) by mouth every 6 (six) hours. Patient not taking: Reported on 01/16/2015 12/18/14   Elson Areas, PA-C  metoCLOPramide (REGLAN) 10 MG tablet Take 1 tablet (10 mg total) by mouth every 6 (six) hours. Patient not taking: Reported on 01/16/2015 12/18/14   Elson Areas, PA-C  Prenatal Vit-Fe Fumarate-FA (PRENATAL COMPLETE) 14-0.4 MG TABS One tablet a day Patient not taking: Reported on 01/16/2018 12/18/14   Elson Areas, PA-C  traMADol (ULTRAM) 50 MG tablet Take 1 tablet (50 mg total) by mouth every 6 (six) hours as needed. Patient not taking: Reported on 01/16/2018 03/27/15   Danelle Berryapia, Leisa, PA-C    Family History Family History  Adopted: Yes    Social History Social History   Tobacco Use  . Smoking status: Former Smoker    Last attempt to quit: 06/16/2013    Years since quitting: 4.5  . Smokeless tobacco: Never Used  Substance Use Topics  . Alcohol use: Yes    Comment: occ  . Drug use: No     Allergies   Patient has no known allergies.   Review of Systems Review of Systems  All other systems reviewed and are negative.    Physical Exam Updated Vital Signs BP (!) 152/121   Pulse (!) 55   Temp 98.7 F (37.1 C) (Oral)   Resp 16   SpO2 100%   Physical Exam  Nursing note and vitals reviewed.  37 year old female, resting  comfortably and in no acute distress. Vital signs are significant for elevated blood pressure. Oxygen saturation is 100%, which is normal. Head is normocephalic and atraumatic. PERRLA, EOMI. Oropharynx is clear. Neck is nontender and supple without adenopathy or JVD. Back is nontender and there is no CVA tenderness. Lungs are clear without rales, wheezes, or rhonchi. Chest is moderately tender in the left parasternal area.  There is no crepitus. Heart has regular rate and rhythm without murmur. Abdomen is soft, flat, nontender without masses or hepatosplenomegaly and peristalsis is normoactive. Extremities have no cyanosis or edema, full range of motion is present. Skin is warm and dry without rash. Neurologic: Mental status is normal, cranial nerves are intact, there are no motor or sensory deficits.  ED Treatments / Results  Labs (all labs ordered are listed, but only abnormal results are displayed) Labs Reviewed  BASIC METABOLIC PANEL - Abnormal; Notable for the following components:      Result Value   Creatinine, Ser 1.01 (*)    All other components within normal limits  CBC - Abnormal; Notable for the following components:   WBC 11.3 (*)    Platelets 468 (*)    All other components within normal limits  I-STAT TROPONIN, ED  I-STAT BETA HCG BLOOD, ED ([facility], [facility], [facility] ONLY)  I-STAT TROPONIN, ED    EKG EKG Interpretation  Date/Time:  Saturday January 16 2018 00:06:33 EDT Ventricular Rate:  59 PR Interval:  150 QRS Duration: 86 QT Interval:  382 QTC Calculation: 378 R Axis:   17 Text Interpretation:  Sinus bradycardia Otherwise normal ECG No old tracing to compare Confirmed by Dione BoozeGlick, Arlen Dupuis ([id number]) on 01/16/2018 3:02:48 AM   Radiology Dg Chest 2 View  Result Date: 01/16/2018 CLINICAL DATA:  Chest pressure radiating to the back. EXAM: CHEST - 2 VIEW COMPARISON:  None. FINDINGS: The heart size and mediastinal contours are within normal limits. Nonaneurysmal thoracic aorta. No  mediastinal widening. Both lungs are clear. No acute osseous abnormality. Slight disc space narrowing along the midthoracic spine. IMPRESSION: No active cardiopulmonary disease. Mild multilevel disc space narrowing along the midthoracic spine. Electronically Signed   By: Tollie Ethavid  Kwon M.D.   On: 01/16/2018 00:36    Procedures Procedures  Medications Ordered in ED Medications  gi cocktail (Maalox,Lidocaine,Donnatal) (30 mLs Oral Given 01/16/18 0435)     Initial Impression / Assessment and Plan / ED Course  I have reviewed the triage vital signs and the nursing notes.  Pertinent  labs & imaging results that were available during my care of the patient were reviewed by me and considered in my medical decision making (see chart for details).  Chest pain of uncertain cause.  ECG shows no acute changes, chest x-ray is normal, troponin is normal x2.  Old records are reviewed, and she has no relevant past visits.  Heart score is 1, which puts her at very low risk of major adverse cardiac events in the next 6 weeks.  Pulmonary embolism is effectively ruled out by [facility] and [facility] criteria.  She will be given a therapeutic trial of a GI cocktail.  If this fails, will discharge on NSAIDs.  Tight feeling in the chest that better with GI cocktail, but she is still complaining of pain.  I believe that the pain that she is having is musculoskeletal.  I suspect a tight feeling was related to GERD.  She is discharged with prescription for pantoprazole.  She is also requesting refills on her psychiatric medications of lurasidone, lamotrigine, lorazepam.  These are written for her.  She is given resource guide to try to get reestablished with mental health services (she states that she had been going to [place] and was told that she could not come back there).  Also, referred to cardiology for follow-up.  Final Clinical Impressions(s) / ED Diagnoses   Final diagnoses:  Atypical chest pain    ED Discharge Orders           Ordered    pantoprazole (PROTONIX) 40 MG tablet  Daily     01/16/18 0515    lurasidone (LATUDA) 40 MG TABS tablet  Daily with breakfast     01/16/18 0515    lamoTRIgine (LAMICTAL) 25 MG tablet  Daily     01/16/18 0515    LORazepam (ATIVAN) 1 MG tablet  3 times daily PRN     01/16/18 0515           Dione Booze, MD 01/16/18 0522

## 2018-01-16 NOTE — ED Triage Notes (Signed)
Pt endorses chest pressure centralized that radiates to back between shoulder blades. States she was diaphoretic and shob at home. Constant in nature. No nausea.

## 2018-01-16 NOTE — Discharge Instructions (Addendum)
Take ibuprofen or naproxen as needed for pain. °

## 2018-06-16 DIAGNOSIS — Z008 Encounter for other general examination: Secondary | ICD-10-CM | POA: Diagnosis not present

## 2018-06-16 DIAGNOSIS — Z87898 Personal history of other specified conditions: Secondary | ICD-10-CM | POA: Diagnosis not present

## 2018-06-16 DIAGNOSIS — F17200 Nicotine dependence, unspecified, uncomplicated: Secondary | ICD-10-CM | POA: Diagnosis not present

## 2018-06-16 DIAGNOSIS — F250 Schizoaffective disorder, bipolar type: Secondary | ICD-10-CM | POA: Diagnosis not present

## 2018-06-16 DIAGNOSIS — I10 Essential (primary) hypertension: Secondary | ICD-10-CM | POA: Diagnosis not present

## 2018-06-16 DIAGNOSIS — F1490 Cocaine use, unspecified, uncomplicated: Secondary | ICD-10-CM | POA: Diagnosis not present

## 2018-06-16 DIAGNOSIS — F4310 Post-traumatic stress disorder, unspecified: Secondary | ICD-10-CM | POA: Diagnosis not present

## 2018-06-16 DIAGNOSIS — Z0000 Encounter for general adult medical examination without abnormal findings: Secondary | ICD-10-CM | POA: Diagnosis not present

## 2018-06-16 DIAGNOSIS — Z1159 Encounter for screening for other viral diseases: Secondary | ICD-10-CM | POA: Diagnosis not present

## 2018-06-16 DIAGNOSIS — F339 Major depressive disorder, recurrent, unspecified: Secondary | ICD-10-CM | POA: Diagnosis not present

## 2018-06-16 DIAGNOSIS — F3132 Bipolar disorder, current episode depressed, moderate: Secondary | ICD-10-CM | POA: Diagnosis not present

## 2018-07-13 ENCOUNTER — Ambulatory Visit (HOSPITAL_COMMUNITY)
Admission: EM | Admit: 2018-07-13 | Discharge: 2018-07-13 | Disposition: A | Discharge status: Home / Self Care | Payer: Medicare HMO | Attending: Family Medicine | Admitting: Family Medicine

## 2018-07-13 ENCOUNTER — Encounter (HOSPITAL_COMMUNITY): Payer: Self-pay | Admitting: Emergency Medicine

## 2018-07-13 ENCOUNTER — Ambulatory Visit (INDEPENDENT_AMBULATORY_CARE_PROVIDER_SITE_OTHER): Payer: Medicare HMO

## 2018-07-13 DIAGNOSIS — M7989 Other specified soft tissue disorders: Secondary | ICD-10-CM | POA: Diagnosis not present

## 2018-07-13 DIAGNOSIS — M25562 Pain in left knee: Secondary | ICD-10-CM

## 2018-07-13 DIAGNOSIS — M25462 Effusion, left knee: Secondary | ICD-10-CM

## 2018-07-13 MED ORDER — NAPROXEN 500 MG PO TABS: 500.0000 mg | ORAL_TABLET | Freq: Two times a day (BID) | ORAL | 0 refills | Status: DC

## 2018-07-13 NOTE — ED Provider Notes (Signed)
[facility]    CSN: [id number] Arrival date & time: 07/13/18  1124     History   Chief Complaint Chief Complaint  Patient presents with  . Knee Pain    HPI Lori Hahn is a 38 y.o. female who presents to the UC with knee pain. The pain is located in the left knee and started 3 days ago. Patient denies any injury and reports she just woke one morning and the pain was there.   HPI  Past Medical History:  Diagnosis Date  . Abnormal Pap smear of cervix    has colpo  . Anemia   . Depression     Patient Active Problem List   Diagnosis Date Noted  . Marijuana smoker 01/25/2015    Past Surgical History:  Procedure Laterality Date  . COLPOSCOPY  2001   dysplasia  . TONSILLECTOMY    . UMBILICAL HERNIA REPAIR  07/2012    OB History    Gravida  8   Para  5   Term  5   Preterm      AB  2   Living  5     SAB  2   TAB      Ectopic      Multiple      Live Births  5        Obstetric Comments  Blighted ovum-2003         Home Medications    Prior to Admission medications   Medication Sig Start Date End Date Taking? Authorizing Provider  lamoTRIgine (LAMICTAL) 25 MG tablet Take 25 mg by mouth daily.    [provider]  lamoTRIgine (LAMICTAL) 25 MG tablet Take 1 tablet (25 mg total) by mouth daily. Patient not taking: Reported on 07/13/2018 01/16/18   Dione BoozeGlick, David, MD  LORazepam (ATIVAN) 1 MG tablet Take 1 mg by mouth every 8 (eight) hours as needed for anxiety.    [provider]  LORazepam (ATIVAN) 1 MG tablet Take 1 tablet (1 mg total) by mouth 3 (three) times daily as needed for anxiety. Patient not taking: Reported on 07/13/2018 01/16/18   Dione BoozeGlick, David, MD  lurasidone (LATUDA) 40 MG TABS tablet Take 40 mg by mouth daily with breakfast.    [provider]  lurasidone (LATUDA) 40 MG TABS tablet Take 1 tablet (40 mg total) by mouth daily with breakfast. Patient not taking: Reported on 07/13/2018 01/16/18   Dione BoozeGlick, David,  MD  naproxen (NAPROSYN) 500 MG tablet Take 1 tablet (500 mg total) by mouth 2 (two) times daily. 07/13/18   Janne NapoleonNeese, Dahna Hattabaugh M, NP  pantoprazole (PROTONIX) 40 MG tablet Take 1 tablet (40 mg total) by mouth daily. Patient not taking: Reported on 07/13/2018 01/16/18   Dione BoozeGlick, David, MD    Family History Family History  Adopted: Yes    Social History Social History   Tobacco Use  . Smoking status: Former Smoker    Last attempt to quit: 06/16/2013    Years since quitting: 5.0  . Smokeless tobacco: Never Used  Substance Use Topics  . Alcohol use: Yes    Comment: occ  . Drug use: No     Allergies   Patient has no known allergies.   Review of Systems Review of Systems  Musculoskeletal: Positive for arthralgias and joint swelling.  All other systems reviewed and are negative.    Physical Exam Triage Vital Signs ED Triage Vitals  Enc Vitals Group     BP 07/13/18 1305 Marland Kitchen(!)  156/100     Pulse Rate 07/13/18 1305 70     Resp 07/13/18 1305 18     Temp 07/13/18 1305 98.6 F (37 C)     Temp src --      SpO2 07/13/18 1305 100 %     Weight --      Height --      Head Circumference --      Peak Flow --      Pain Score 07/13/18 1306 1     Pain Loc --      Pain Edu? --      Excl. in GC? --    No data found.  Updated Vital Signs BP (!) 156/100   Pulse 70   Temp 98.6 F (37 C)   Resp 18   LMP 06/12/2018   SpO2 100%   Visual Acuity Right Eye Distance:   Left Eye Distance:   Bilateral Distance:    Right Eye Near:   Left Eye Near:    Bilateral Near:     Physical Exam Vitals signs and nursing note reviewed.  Constitutional:      General: She is not in acute distress.    Appearance: She is well-developed.  HENT:     Head: Normocephalic.     Nose: No congestion.     Mouth/Throat:     Mouth: Mucous membranes are moist.  Eyes:     Conjunctiva/sclera: Conjunctivae normal.  Neck:     Musculoskeletal: Neck supple.  Cardiovascular:     Rate and Rhythm: Normal rate.    Pulmonary:     Effort: Pulmonary effort is normal.  Musculoskeletal:     Left knee: She exhibits swelling. She exhibits no deformity, no laceration, no erythema and normal alignment. Decreased range of motion: due to pain. Tenderness found.       Legs:  Skin:    General: Skin is warm and dry.  Neurological:     Mental Status: She is alert and oriented to person, place, and time.     Motor: Motor function is intact.     Comments: Pedal pulses 2+, adequate circulation. Patient can ambulate but c/o left knee feeling weak when pressure applied.   Psychiatric:        Mood and Affect: Mood normal.      UC Treatments / Results  Labs (all labs ordered are listed, but only abnormal results are displayed) Labs Reviewed - No data to display Radiology Dg Knee Complete 4 Views Left  Result Date: 07/13/2018 CLINICAL DATA:  Left knee pain and swelling since yesterday. No known injury. EXAM: LEFT KNEE - COMPLETE 4+ VIEW COMPARISON:  None. FINDINGS: No evidence of fracture, dislocation, or joint effusion. No evidence of arthropathy or other focal bone abnormality. Soft tissues are unremarkable. IMPRESSION: Normal examination. Electronically Signed   By: Beckie Salts M.D.   On: 07/13/2018 13:38    Procedures Procedures (including critical care time)  Medications Ordered in UC Medications - No data to display  Initial Impression / Assessment and Plan / UC Course  I have reviewed the triage vital signs and the nursing notes. 38 y.o. female here with left knee pain and swelling stable for d/c without signs of compartment syndrome. Knee brace applied, crutches, RICE and f/u with ortho.  Final Clinical Impressions(s) / UC Diagnoses   Final diagnoses:  Pain and swelling of knee, left     Discharge Instructions     Follow up with the orthopedic doctor for  further evaluation.     ED Prescriptions    Medication Sig Dispense Auth. Provider   naproxen (NAPROSYN) 500 MG tablet Take 1 tablet  (500 mg total) by mouth 2 (two) times daily. 20 tablet Janne Napoleon, NP     Controlled Substance Prescriptions [place] Controlled Substance Registry consulted? Not Applicable   Janne Napoleon, NP 07/13/18 1359

## 2018-07-13 NOTE — ED Triage Notes (Signed)
Pt c/o L knee pain x3 days, denies injury, states she just woke up one day and it was swollen, states she feels unsteady on her feet.

## 2018-07-13 NOTE — Discharge Instructions (Signed)
Follow up with the orthopedic doctor for further evaluation.

## 2018-07-21 ENCOUNTER — Encounter (HOSPITAL_COMMUNITY): Payer: Self-pay | Admitting: Emergency Medicine

## 2018-07-21 ENCOUNTER — Ambulatory Visit (HOSPITAL_COMMUNITY)
Admission: EM | Admit: 2018-07-21 | Discharge: 2018-07-21 | Disposition: A | Discharge status: Home / Self Care | Payer: Medicare HMO | Attending: Internal Medicine | Admitting: Internal Medicine

## 2018-07-21 DIAGNOSIS — M62830 Muscle spasm of back: Secondary | ICD-10-CM

## 2018-07-21 DIAGNOSIS — R030 Elevated blood-pressure reading, without diagnosis of hypertension: Secondary | ICD-10-CM | POA: Diagnosis not present

## 2018-07-21 MED ORDER — CYCLOBENZAPRINE HCL 10 MG PO TABS: 10.0000 mg | ORAL_TABLET | Freq: Every evening | ORAL | 0 refills | Status: DC | PRN

## 2018-07-21 MED ORDER — NAPROXEN 500 MG PO TABS: 500.0000 mg | ORAL_TABLET | Freq: Two times a day (BID) | ORAL | 0 refills | Status: DC

## 2018-07-21 NOTE — Discharge Instructions (Addendum)
Rest, ice and heat as needed Ensure adequate ROM as tolerated. Injuries all appear to be muscular in nature. Prescribed naproxen as needed for inflammation and pain relief Prescribed flexeril as needed at bedtime for muscle spasm.  Do not drive or operate heavy machinery while taking this medication Expect some increased pain in the next 1-3 days.  It may take 3-4 weeks for complete resolution of symptoms Will f/u with her doctor or here if not seeing significant improvement within one week. Return here or go to ER if you have any new or worsening symptoms such as numbness/tingling of the inner thighs, loss of bladder or bowel control, headache/blurry vision, nausea/vomiting, confusion/altered mental status, dizziness, weakness, passing out, imbalance, etc...  Blood pressure elevated in office.  Please recheck in 24 hours.  If it continues to be greater than 140/90 please follow up with PCP for further evaluation and management.
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
## 2018-12-21 DIAGNOSIS — M79605 Pain in left leg: Secondary | ICD-10-CM | POA: Diagnosis not present

## 2018-12-21 DIAGNOSIS — I87393 Chronic venous hypertension (idiopathic) with other complications of bilateral lower extremity: Secondary | ICD-10-CM | POA: Diagnosis not present

## 2018-12-21 DIAGNOSIS — M79604 Pain in right leg: Secondary | ICD-10-CM | POA: Diagnosis not present

## 2019-01-07 ENCOUNTER — Ambulatory Visit (INDEPENDENT_AMBULATORY_CARE_PROVIDER_SITE_OTHER): Payer: Medicare HMO

## 2019-01-07 ENCOUNTER — Encounter (HOSPITAL_COMMUNITY): Payer: Self-pay | Admitting: Emergency Medicine

## 2019-01-07 ENCOUNTER — Other Ambulatory Visit: Payer: Self-pay

## 2019-01-07 ENCOUNTER — Ambulatory Visit (HOSPITAL_COMMUNITY)
Admission: EM | Admit: 2019-01-07 | Discharge: 2019-01-07 | Disposition: A | Discharge status: Home / Self Care | Payer: Medicare HMO | Attending: Urgent Care | Admitting: Urgent Care

## 2019-01-07 DIAGNOSIS — M79642 Pain in left hand: Secondary | ICD-10-CM

## 2019-01-07 DIAGNOSIS — S63602A Unspecified sprain of left thumb, initial encounter: Secondary | ICD-10-CM | POA: Diagnosis not present

## 2019-01-07 DIAGNOSIS — M79645 Pain in left finger(s): Secondary | ICD-10-CM

## 2019-01-07 DIAGNOSIS — M25532 Pain in left wrist: Secondary | ICD-10-CM

## 2019-01-07 MED ORDER — KETOROLAC TROMETHAMINE 60 MG/2ML IM SOLN
INTRAMUSCULAR | Status: AC
Start: 2019-01-07 — End: ?
  Filled 2019-01-07: qty 2

## 2019-01-07 MED ORDER — KETOROLAC TROMETHAMINE 60 MG/2ML IM SOLN
60.0000 mg | Freq: Once | INTRAMUSCULAR | Status: AC
  Administered 2019-01-07: 60 mg via INTRAMUSCULAR

## 2019-01-07 MED ORDER — MELOXICAM 15 MG PO TABS: 15.0000 mg | ORAL_TABLET | Freq: Every day | ORAL | 0 refills | Status: DC
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
## 2019-01-11 DIAGNOSIS — M79605 Pain in left leg: Secondary | ICD-10-CM | POA: Diagnosis not present

## 2019-01-11 DIAGNOSIS — M79604 Pain in right leg: Secondary | ICD-10-CM | POA: Diagnosis not present

## 2019-01-11 DIAGNOSIS — I87393 Chronic venous hypertension (idiopathic) with other complications of bilateral lower extremity: Secondary | ICD-10-CM | POA: Diagnosis not present

## 2019-01-12 DIAGNOSIS — I83813 Varicose veins of bilateral lower extremities with pain: Secondary | ICD-10-CM | POA: Diagnosis not present

## 2019-06-09 IMAGING — DX DG KNEE COMPLETE 4+V*L*
4 series · 4 of 4 positions shown · non-contrast
Comparison: None.

CLINICAL DATA: Left knee pain and swelling since yesterday. No
known injury.

EXAM:
LEFT KNEE - COMPLETE 4+ VIEW

[knee ap]
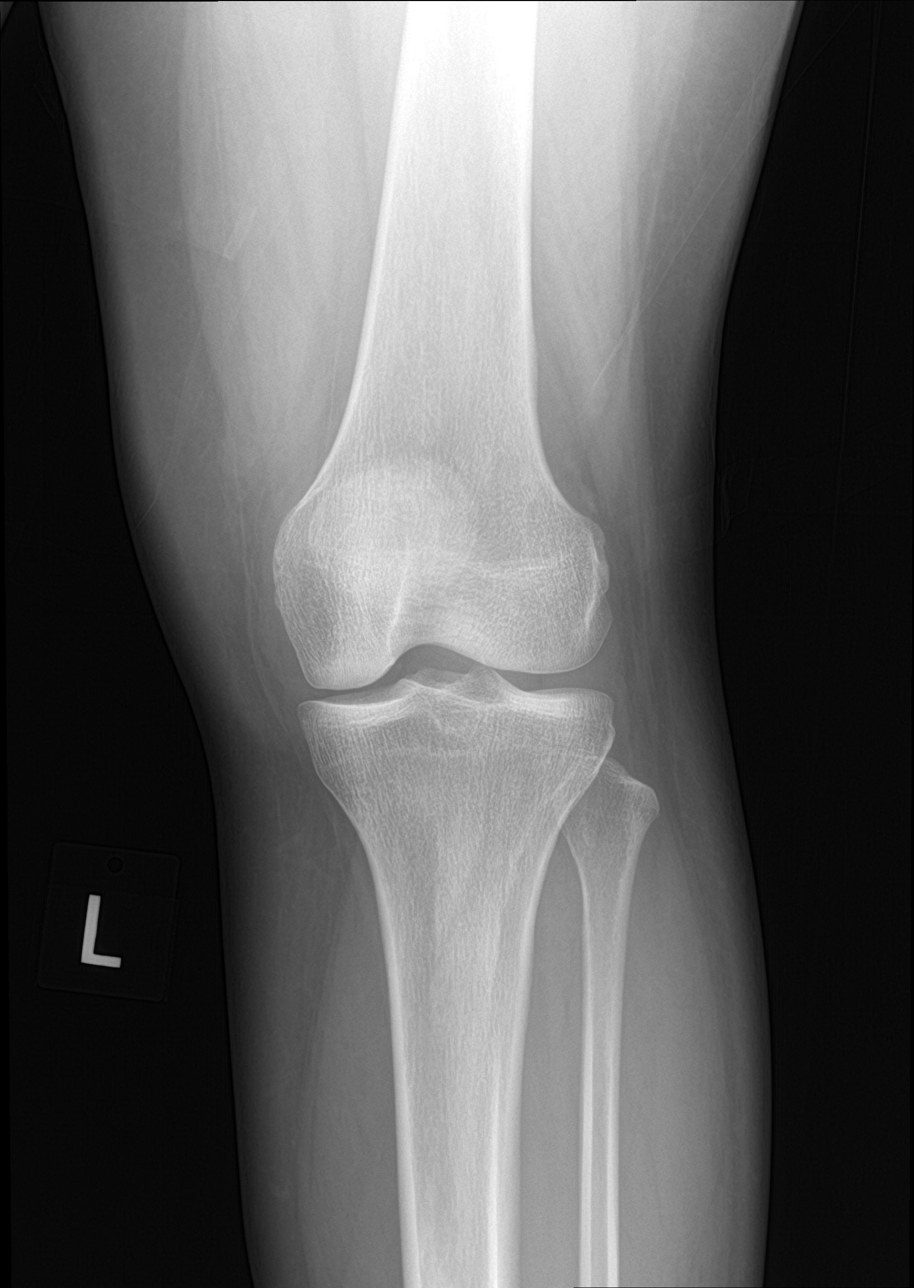

[knee obl (1 of 2)]
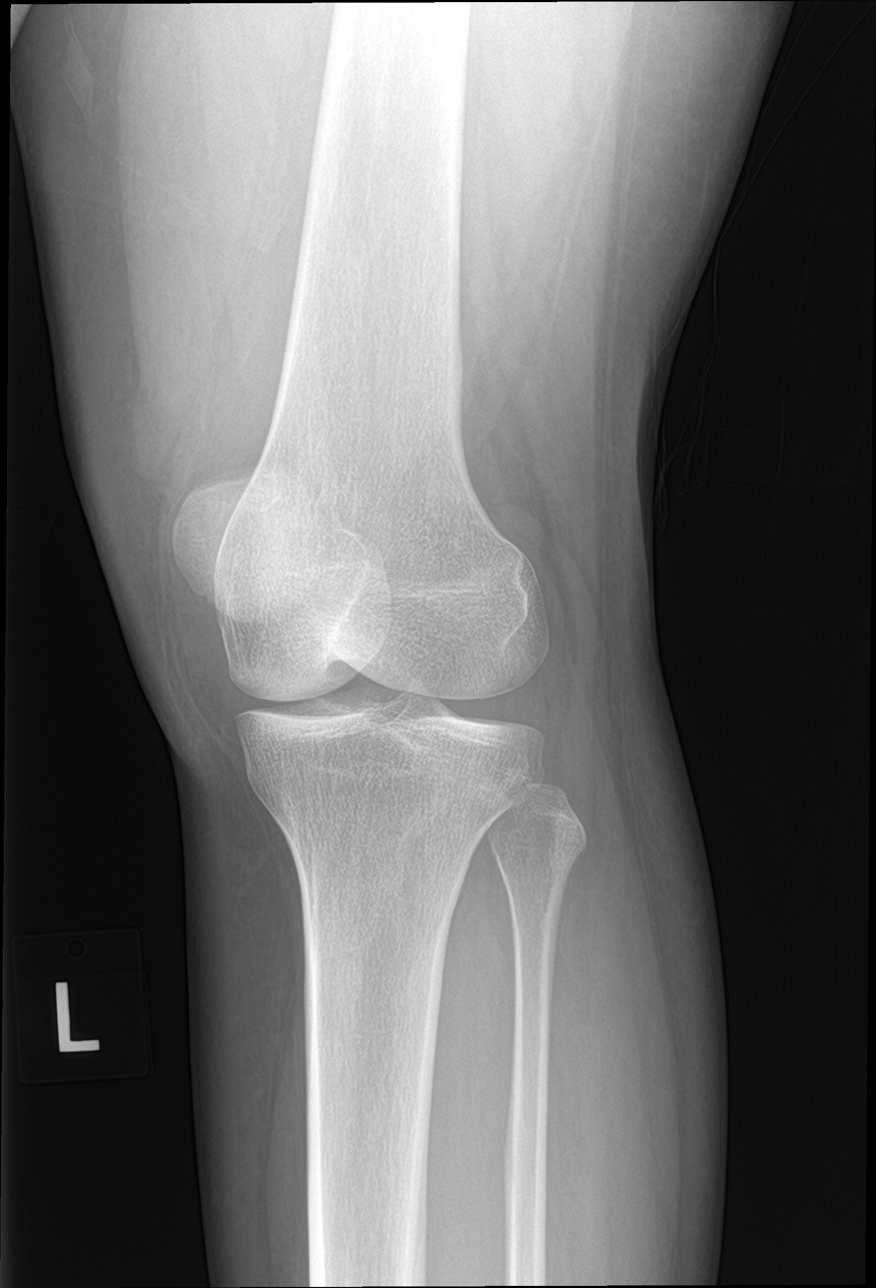

[knee obl (2 of 2)]
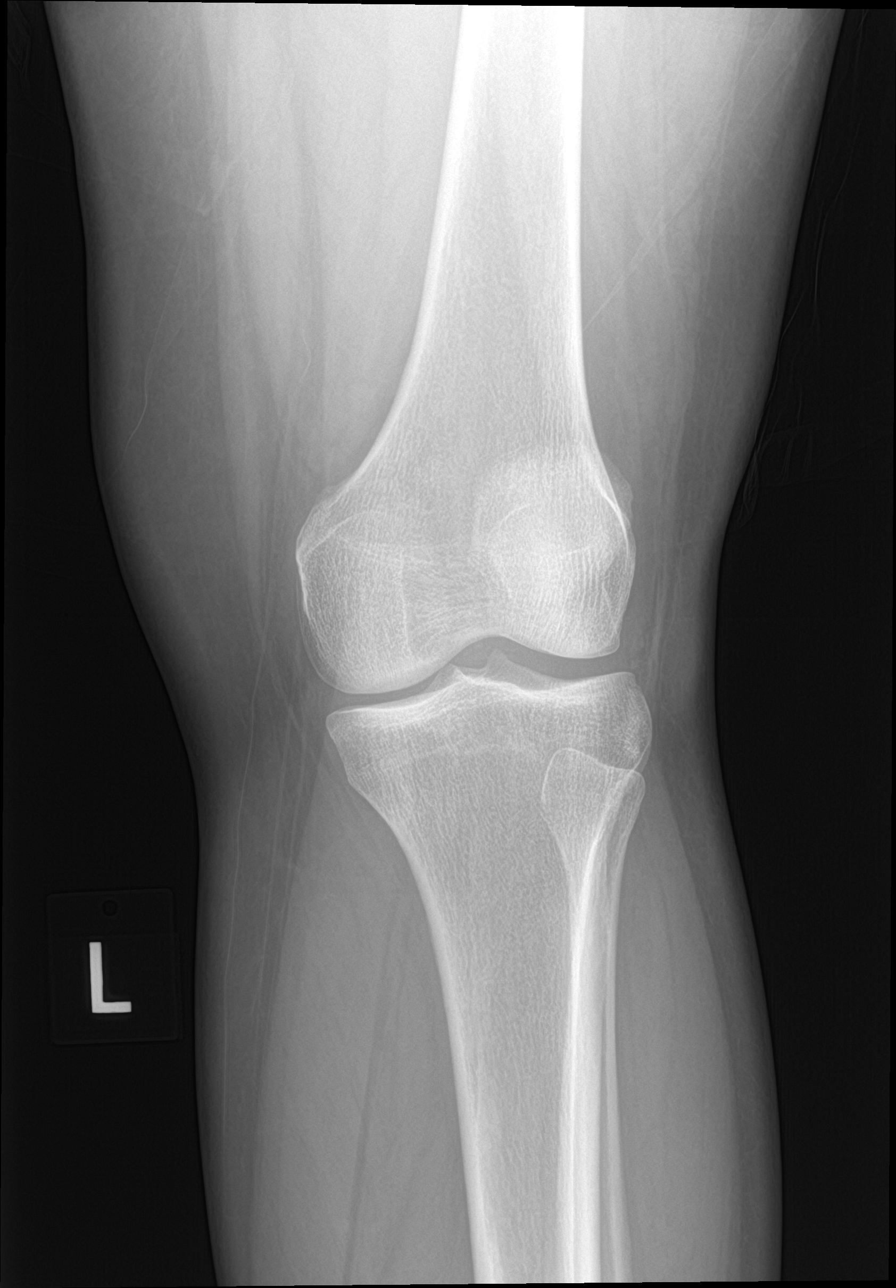

[knee lat]
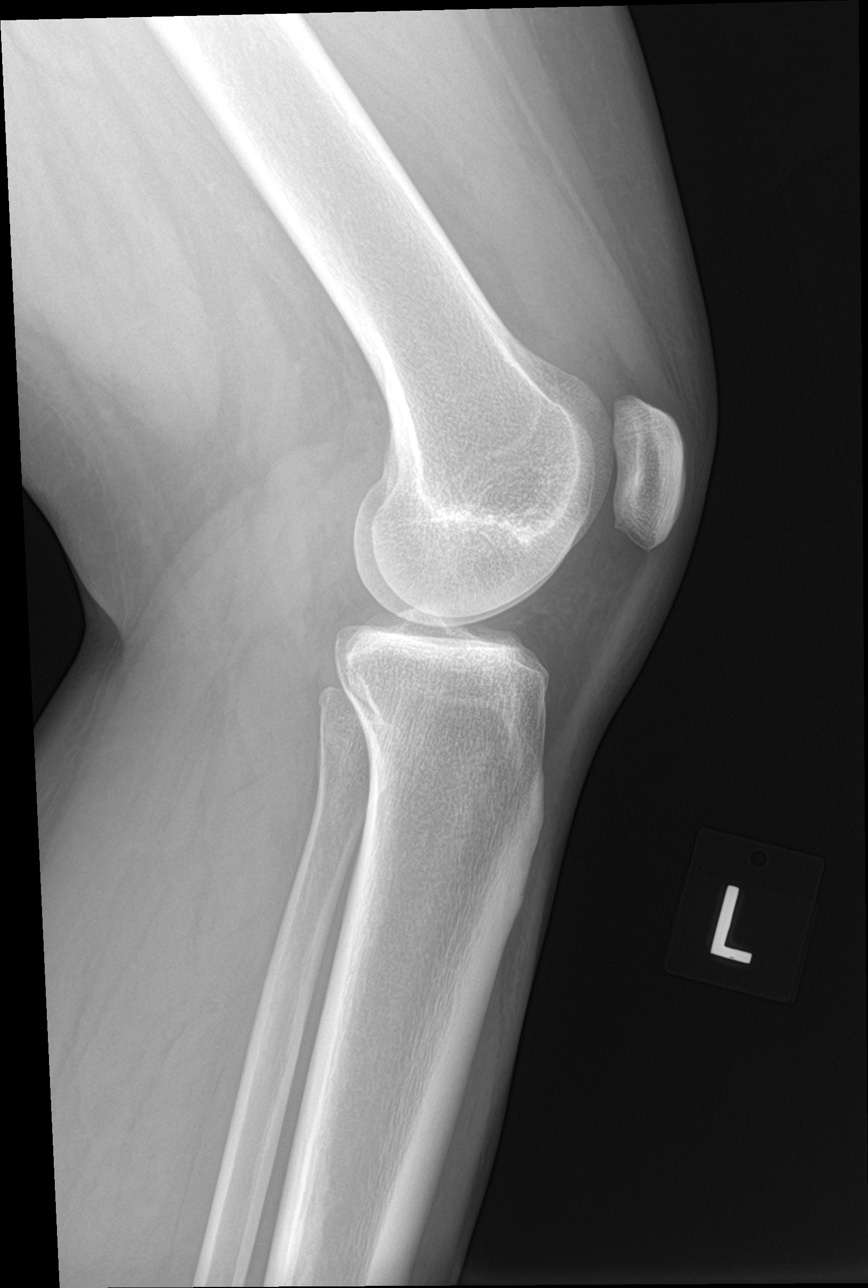

[4 of 4 positions shown; findings below may reference images not displayed]

FINDINGS: No evidence of fracture, dislocation, or joint effusion. No evidence
of arthropathy or other focal bone abnormality. Soft tissues are
unremarkable.
IMPRESSION: Normal examination.

## 2019-11-01 ENCOUNTER — Ambulatory Visit: Payer: Medicare HMO | Admitting: Sports Medicine

## 2020-02-12 ENCOUNTER — Other Ambulatory Visit: Payer: Self-pay

## 2020-02-12 ENCOUNTER — Ambulatory Visit (INDEPENDENT_AMBULATORY_CARE_PROVIDER_SITE_OTHER): Payer: Medicare HMO

## 2020-02-12 ENCOUNTER — Ambulatory Visit (HOSPITAL_COMMUNITY)
Admission: EM | Admit: 2020-02-12 | Discharge: 2020-02-12 | Disposition: A | Discharge status: Home / Self Care | Payer: Medicare HMO | Attending: Emergency Medicine | Admitting: Emergency Medicine

## 2020-02-12 ENCOUNTER — Encounter (HOSPITAL_COMMUNITY): Payer: Self-pay

## 2020-02-12 DIAGNOSIS — S9031XA Contusion of right foot, initial encounter: Secondary | ICD-10-CM

## 2020-02-12 DIAGNOSIS — M79671 Pain in right foot: Secondary | ICD-10-CM | POA: Diagnosis not present

## 2020-02-12 MED ORDER — IBUPROFEN 800 MG PO TABS
ORAL_TABLET | ORAL | Status: AC
  Filled 2020-02-12: qty 1

## 2020-02-12 MED ORDER — IBUPROFEN 800 MG PO TABS
800.0000 mg | ORAL_TABLET | Freq: Once | ORAL | Status: AC
  Administered 2020-02-12: 800 mg via ORAL

## 2020-02-12 NOTE — Discharge Instructions (Signed)
Xray is normal today, no indication of fracture or break to toes.  Consistent with bruising which can cause significant pain.  Ice, elevation, nsaids such as ibuprofen or aleve to help with pain.  Activity as tolerated.  I would expect gradual improvement over the next 2 weeks.

## 2020-02-12 NOTE — ED Provider Notes (Signed)
[facility]    CSN: [id number] Arrival date & time: 02/12/20  1010      History   Chief Complaint Chief Complaint  Patient presents with  . Toe Injury    HPI Lori Hahn is a 39 y.o. female.   Aryan Bello presents with complaints of 2nd and 3rd toe pain after a lid from a pot fell directly on them yesterday afternoon. Has taken tylenol and motrin, ice and elevation, which haven't helped. No numbness or tingling. Pain worse with movement or touch. No previous injury to these toes. Nails intact.    ROS per HPI, negative if not otherwise mentioned.      Past Medical History:  Diagnosis Date  . Abnormal Pap smear of cervix    has colpo  . Anemia   . Depression     Patient Active Problem List   Diagnosis Date Noted  . Marijuana smoker 01/25/2015    Past Surgical History:  Procedure Laterality Date  . COLPOSCOPY  2001   dysplasia  . TONSILLECTOMY    . UMBILICAL HERNIA REPAIR  07/2012    OB History    Gravida  8   Para  5   Term  5   Preterm      AB  2   Living  5     SAB  2   TAB      Ectopic      Multiple      Live Births  5        Obstetric Comments  Blighted ovum-2003         Home Medications    Prior to Admission medications   Medication Sig Start Date End Date Taking? Authorizing Provider  meloxicam (MOBIC) 15 MG tablet Take 1 tablet (15 mg total) by mouth daily. 01/07/19   Wallis Bamberg, PA-C  naproxen (NAPROSYN) 500 MG tablet Take 1 tablet (500 mg total) by mouth 2 (two) times daily. 07/21/18   Rennis Harding, PA-C    Family History Family History  Adopted: Yes    Social History Social History   Tobacco Use  . Smoking status: Former Smoker    Quit date: 06/16/2013    Years since quitting: 6.6  . Smokeless tobacco: Never Used  Substance Use Topics  . Alcohol use: Yes    Comment: occ  . Drug use: No     Allergies   Patient has no known allergies.   Review of Systems Review of  Systems   Physical Exam Triage Vital Signs ED Triage Vitals  Enc Vitals Group     BP 02/12/20 1054 (!) 164/100     Pulse Rate 02/12/20 1054 87     Resp 02/12/20 1054 20     Temp 02/12/20 1054 98.3 F (36.8 C)     Temp Source 02/12/20 1054 Oral     SpO2 02/12/20 1054 100 %     Weight --      Height --      Head Circumference --      Peak Flow --      Pain Score 02/12/20 1056 9     Pain Loc --      Pain Edu? --      Excl. in GC? --    No data found.  Updated Vital Signs BP (!) 164/100 (BP Location: Left Arm)   Pulse 87   Temp 98.3 F (36.8 C) (Oral)   Resp 20   LMP 01/18/2020   SpO2  100%   Visual Acuity Right Eye Distance:   Left Eye Distance:   Bilateral Distance:    Right Eye Near:   Left Eye Near:    Bilateral Near:     Physical Exam Constitutional:      General: She is not in acute distress.    Appearance: She is well-developed.  Cardiovascular:     Rate and Rhythm: Normal rate.  Pulmonary:     Effort: Pulmonary effort is normal.  Musculoskeletal:     Right foot: Decreased range of motion. Swelling, tenderness and bony tenderness present. No deformity or laceration.     Comments: Right foot 2nd and 3rd toes grossly tender with swelling noted, no specific erythema however, nail beds normal; pain with ROM; sensation intact   Skin:    General: Skin is warm and dry.  Neurological:     Mental Status: She is alert and oriented to person, place, and time.      UC Treatments / Results  Labs (all labs ordered are listed, but only abnormal results are displayed) Labs Reviewed - No data to display  EKG   Radiology DG Foot Complete Right  Result Date: 02/12/2020 CLINICAL DATA:  Acute right foot pain after injury yesterday EXAM: RIGHT FOOT COMPLETE - 3+ VIEW COMPARISON:  None. FINDINGS: There is no evidence of fracture or dislocation. There is no evidence of arthropathy or other focal bone abnormality. Soft tissues are unremarkable. IMPRESSION: Negative.  Electronically Signed   By: Lupita Raider M.D.   On: 02/12/2020 11:35    Procedures Procedures (including critical care time)  Medications Ordered in UC Medications  ibuprofen (ADVIL) tablet 800 mg (800 mg Oral Given 02/12/20 1116)    Initial Impression / Assessment and Plan / UC Course  I have reviewed the triage vital signs and the nursing notes.  Pertinent labs & imaging results that were available during my care of the patient were reviewed by me and considered in my medical decision making (see chart for details).     Xray without acute findings. Consistent with contusion. Pain management discussed. Patient verbalized understanding and agreeable to plan.   Final Clinical Impressions(s) / UC Diagnoses   Final diagnoses:  None   Discharge Instructions   None    ED Prescriptions    None     PDMP not reviewed this encounter.   Georgetta Haber, NP 02/12/20 1145

## 2020-02-12 NOTE — ED Triage Notes (Signed)
Pt presents with right toe injury after a glass pot lid fell directly on it from the counter height.

## 2020-05-06 ENCOUNTER — Encounter (HOSPITAL_COMMUNITY): Payer: Self-pay

## 2020-05-06 ENCOUNTER — Other Ambulatory Visit: Payer: Self-pay

## 2020-05-06 ENCOUNTER — Ambulatory Visit (HOSPITAL_COMMUNITY)
Admission: EM | Admit: 2020-05-06 | Discharge: 2020-05-06 | Disposition: A | Discharge status: Home / Self Care | Payer: Medicare HMO | Attending: Family Medicine | Admitting: Family Medicine

## 2020-05-06 DIAGNOSIS — H9201 Otalgia, right ear: Secondary | ICD-10-CM | POA: Diagnosis not present

## 2020-05-06 DIAGNOSIS — H6591 Unspecified nonsuppurative otitis media, right ear: Secondary | ICD-10-CM

## 2020-05-06 DIAGNOSIS — H60391 Other infective otitis externa, right ear: Secondary | ICD-10-CM

## 2020-05-06 MED ORDER — FLUCONAZOLE 150 MG PO TABS: ORAL_TABLET | ORAL | 0 refills | Status: DC

## 2020-05-06 MED ORDER — AMOXICILLIN-POT CLAVULANATE 875-125 MG PO TABS: 1.0000 | ORAL_TABLET | Freq: Two times a day (BID) | ORAL | 0 refills | Status: AC

## 2020-05-06 MED ORDER — POLYMYXIN B-TRIMETHOPRIM 10000-0.1 UNIT/ML-% OP SOLN: 2.0000 [drp] | OPHTHALMIC | 0 refills | Status: DC

## 2020-05-06 NOTE — ED Provider Notes (Signed)
[facility]   [id number] 05/06/20 Arrival Time: 1118  CC: EAR PAIN  SUBJECTIVE: History from: patient.  Lori Hahn is a 39 y.o. female who presents with of right ear pain for the past days. Reports that she has been having itching in the right ear canal, as well as pain and swelling around the ear. Denies a precipitating event, such as swimming or wearing ear plugs. Patient states the pain is constant and achy in character. Patient has not taken OTC medications for this. Symptoms are made worse with lying down. Denies similar symptoms in the past. Denies fever, chills, fatigue, sinus pain, rhinorrhea, ear discharge, sore throat, SOB, wheezing, chest pain, nausea, changes in bowel or bladder habits.    ROS: As per HPI.  All other pertinent ROS negative.     Past Medical History:  Diagnosis Date   Abnormal Pap smear of cervix    has colpo   Anemia    Depression    Past Surgical History:  Procedure Laterality Date   COLPOSCOPY  2001   dysplasia   TONSILLECTOMY     UMBILICAL HERNIA REPAIR  07/2012   No Known Allergies No current facility-administered medications on file prior to encounter.   Current Outpatient Medications on File Prior to Encounter  Medication Sig Dispense Refill   meloxicam (MOBIC) 15 MG tablet Take 1 tablet (15 mg total) by mouth daily. 30 tablet 0   naproxen (NAPROSYN) 500 MG tablet Take 1 tablet (500 mg total) by mouth 2 (two) times daily. 30 tablet 0   Social History   Socioeconomic History   Marital status: Single    Spouse name: Not on file   Number of children: Not on file   Years of education: Not on file   Highest education level: Not on file  Occupational History   Not on file  Tobacco Use   Smoking status: Former Smoker    Quit date: 06/16/2013    Years since quitting: 6.8   Smokeless tobacco: Never Used  Substance and Sexual Activity   Alcohol use: Yes    Comment: occ   Drug use: No   Sexual activity: Yes      Birth control/protection: None, Condom  Other Topics Concern   Not on file  Social History Narrative   Not on file   Social Determinants of Health   Financial Resource Strain:    Difficulty of Paying Living Expenses: Not on file  Food Insecurity:    Worried About Programme researcher, broadcasting/film/video in the Last Year: Not on file   [employer] of Food in the Last Year: Not on file  Transportation Needs:    Lack of Transportation (Medical): Not on file   Lack of Transportation (Non-Medical): Not on file  Physical Activity:    Days of Exercise per Week: Not on file   Minutes of Exercise per Session: Not on file  Stress:    Feeling of Stress : Not on file  Social Connections:    Frequency of Communication with Friends and Family: Not on file   Frequency of Social Gatherings with Friends and Family: Not on file   Attends Religious Services: Not on file   Active Member of Clubs or Organizations: Not on file   Attends Banker Meetings: Not on file   Marital Status: Not on file  Intimate Partner Violence:    Fear of Current or Ex-Partner: Not on file   Emotionally Abused: Not on file   Physically  Abused: Not on file   Sexually Abused: Not on file   Family History  Adopted: Yes    OBJECTIVE:  Vitals:   05/06/20 1225  BP: (!) 139/97  Pulse: 70  Resp: 16  Temp: 98.1 F (36.7 C)  TempSrc: Oral  SpO2: 100%     General appearance: alert; appears fatigued HEENT: Ears: L EAC clear, R EAC with erythema, swelling, drainage, tenderness, L TM pearly gray with visible cone of light, without erythema, L TM erythematous, bulging, with effusion; Eyes: PERRL, EOMI grossly; Sinuses nontender to palpation; Nose: clear rhinorrhea; Throat: oropharynx mildly erythematous, tonsils 1+ without white tonsillar exudates, uvula midline Neck: supple with right LAD Lungs: unlabored respirations, symmetrical air entry; cough: absent; no respiratory distress Heart: regular rate and  rhythm.  Radial pulses 2+ symmetrical bilaterally Skin: warm and dry Psychological: alert and cooperative; normal mood and affect  Imaging: No results found.   ASSESSMENT & PLAN:  1. Right non-suppurative otitis media   2. Otalgia of right ear   3. Other infective acute otitis externa of right ear     Meds ordered this encounter  Medications   amoxicillin-clavulanate (AUGMENTIN) 875-125 MG tablet    Sig: Take 1 tablet by mouth 2 (two) times daily for 7 days.    Dispense:  14 tablet    Refill:  0    Order Specific Question:   Supervising Provider    Answer:   Merrilee Jansky [[id number]]   fluconazole (DIFLUCAN) 150 MG tablet    Sig: Take one tablet at the onset of symptoms. If symptoms are still present 3 days later, take the second tablet.    Dispense:  2 tablet    Refill:  0    Order Specific Question:   Supervising Provider    Answer:   Merrilee Jansky [id number]   trimethoprim-polymyxin b (POLYTRIM) ophthalmic solution    Sig: Place 2 drops into the right ear every 4 (four) hours.    Dispense:  10 mL    Refill:  0    Order Specific Question:   Supervising Provider    Answer:   Merrilee Jansky [[id number]]    Rest and drink plenty of fluids Prescribed augmentin 875 for 7 days Prescribed polytrim drops for OE Prescribed fluconazole in case of yeast Take medications as directed and to completion Continue to use OTC ibuprofen and/ or tylenol as needed for pain control Follow up with PCP if symptoms persists Return here or go to the ER if you have any new or worsening symptoms   Reviewed expectations re: course of current medical issues. Questions answered. Outlined signs and symptoms indicating need for more acute intervention. Patient verbalized understanding. After Visit Summary given.         Moshe Cipro, NP 05/06/20 1257

## 2020-05-06 NOTE — Discharge Instructions (Addendum)
I have sent in Augmentin for you to take twice a day for 7 days.  I have sent in polytrim drops for you to use 2 drops every 4 hours while awake  I have sent in fluconazole in case of yeast. Take one tablet at the onset of symptoms. If symptoms are still present in 3 days, take the second tablet.   Follow up with this office or with primary care if symptoms are persisting.  Follow up in the ER for high fever, trouble swallowing, trouble breathing, other concerning symptoms.

## 2020-05-06 NOTE — ED Triage Notes (Signed)
Pt present right ear pain. Pt states the pain starts in her middle ear and makes the whole bones around the ear painful. Then she can feel a nodule in behind her ear and it painful as well.

## 2020-10-03 ENCOUNTER — Telehealth (HOSPITAL_COMMUNITY): Payer: Medicare HMO | Admitting: Psychiatry

## 2020-11-16 ENCOUNTER — Encounter (HOSPITAL_COMMUNITY): Payer: Self-pay | Admitting: Psychiatry

## 2020-11-16 ENCOUNTER — Telehealth (INDEPENDENT_AMBULATORY_CARE_PROVIDER_SITE_OTHER): Payer: Medicare HMO | Admitting: Psychiatry

## 2020-11-16 DIAGNOSIS — F411 Generalized anxiety disorder: Secondary | ICD-10-CM | POA: Diagnosis not present

## 2020-11-16 DIAGNOSIS — F3130 Bipolar disorder, current episode depressed, mild or moderate severity, unspecified: Secondary | ICD-10-CM | POA: Diagnosis not present

## 2020-11-16 DIAGNOSIS — Z8659 Personal history of other mental and behavioral disorders: Secondary | ICD-10-CM | POA: Diagnosis not present

## 2020-11-16 MED ORDER — GABAPENTIN 100 MG PO CAPS: 100.0000 mg | ORAL_CAPSULE | Freq: Every day | ORAL | 0 refills | Status: DC

## 2020-11-16 MED ORDER — LAMOTRIGINE 25 MG PO TABS: 25.0000 mg | ORAL_TABLET | Freq: Every day | ORAL | 0 refills | Status: DC
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
## 2020-12-08 ENCOUNTER — Other Ambulatory Visit (HOSPITAL_COMMUNITY): Payer: Self-pay | Admitting: Psychiatry

## 2020-12-19 ENCOUNTER — Telehealth (HOSPITAL_COMMUNITY): Payer: Self-pay

## 2020-12-19 ENCOUNTER — Encounter (HOSPITAL_COMMUNITY): Payer: Self-pay | Admitting: Psychiatry

## 2020-12-19 ENCOUNTER — Telehealth (INDEPENDENT_AMBULATORY_CARE_PROVIDER_SITE_OTHER): Payer: Medicare HMO | Admitting: Psychiatry

## 2020-12-19 DIAGNOSIS — F3130 Bipolar disorder, current episode depressed, mild or moderate severity, unspecified: Secondary | ICD-10-CM

## 2020-12-19 DIAGNOSIS — F411 Generalized anxiety disorder: Secondary | ICD-10-CM | POA: Diagnosis not present

## 2020-12-19 DIAGNOSIS — Z8659 Personal history of other mental and behavioral disorders: Secondary | ICD-10-CM

## 2020-12-19 MED ORDER — LURASIDONE HCL 20 MG PO TABS: 20.0000 mg | ORAL_TABLET | Freq: Every day | ORAL | 0 refills | Status: DC

## 2020-12-19 MED ORDER — TRAZODONE HCL 50 MG PO TABS: 50.0000 mg | ORAL_TABLET | Freq: Every day | ORAL | 0 refills | Status: DC

## 2020-12-19 MED ORDER — LAMOTRIGINE 25 MG PO TABS: 75.0000 mg | ORAL_TABLET | Freq: Every day | ORAL | 0 refills | Status: DC

## 2020-12-19 NOTE — Progress Notes (Signed)
[facility] Follow up visit  Patient Identification: Lori Hahn MRN:  [id number] Date of Evaluation:  12/19/2020 Referral Source: primary care Chief Complaint:  follow up  mood symptoms  Visit Diagnosis:    ICD-10-CM   1. Bipolar I disorder, most recent episode depressed (HCC)  F31.30     2. GAD (generalized anxiety disorder)  F41.1     3. H/O borderline personality disorder  Z86.59       Virtual Visit via Video Note Half audio, half video  I connected with Lori Hahn on 12/19/20 at  2:30 PM EDT by a video enabled telemedicine application and verified that I am speaking with the correct person using two identifiers.  Location: Patient: home Provider: home office   I discussed the limitations of evaluation and management by telemedicine and the availability of in person appointments. The patient expressed understanding and agreed to proceed.    I discussed the assessment and treatment plan with the patient. The patient was provided an opportunity to ask questions and all were answered. The patient agreed with the plan and demonstrated an understanding of the instructions.   The patient was advised to call back or seek an in-person evaluation if the symptoms worsen or if the condition fails to improve as anticipated.  I provided 15 minutes of non-face-to-face time during this encounter.     History of Present Illness: Patient is a 40 years old currently single African-American female who has 4 kids.  Currently she is on disability for bipolar and borderline personality traits as stated by her.  She has seen a psychiatrist at [facility] in the past and also 1 or 2 times [place] She has changed services because of moving from [place] to here and was not satisfied with services at [facility]  In the past she has been on Jordan, Lamictal, Klonopin recently she has not been on any medication for the last few months  Last visit we started her on Lamictal hold off on Latuda.  She has some  improvement but still mood swings getting upset easily anxious and poor sleep  She feels gabapentin did not help with sleep or anxiety depression related to her dad's death and anniversary of him dying last year with kidney failure   She has history of sensitive to rejection self cutting behavior low self-esteem and diagnosed with borderline personality traits  She has stopped alcohol she was drinking up to 6 packs she is noticing some withdrawal anxiety symptoms but has been more than 7 to 10 days  Aggravating factors; harsh adopted mom.  Dad died of kidney failure last.  She was close to him and going thru grief, relationship can be edgy Modifying factors; supportive boyfriend, kids    Past medication use has been Seroquel, Paxil, Geodon, Abilify  Medication that has helped Jordan, Lamictal, Klonopin   Past Psychiatric History: borderline personality , bipolar disorder  Previous Psychotropic Medications: Yes   Substance Abuse History in the last 12 months:  Yes.    Consequences of Substance Abuse: Discussed effects of alcohol on medications, judjement, depression  Past Medical History:  Past Medical History:  Diagnosis Date   Abnormal Pap smear of cervix    has colpo   Anemia    Depression     Past Surgical History:  Procedure Laterality Date   COLPOSCOPY  2001   dysplasia   TONSILLECTOMY     UMBILICAL HERNIA REPAIR  07/2012    Family Psychiatric History: adapted   Family History:  Family History  Adopted: Yes    Social History:   Social History   Socioeconomic History   Marital status: Single    Spouse name: Not on file   Number of children: Not on file   Years of education: Not on file   Highest education level: Not on file  Occupational History   Not on file  Tobacco Use   Smoking status: Former    Pack years: 0.00    Types: Cigarettes    Quit date: 06/16/2013    Years since quitting: 7.5   Smokeless tobacco: Never  Substance and Sexual Activity    Alcohol use: Yes    Comment: occ   Drug use: No   Sexual activity: Yes    Birth control/protection: None, Condom  Other Topics Concern   Not on file  Social History Narrative   Not on file   Social Determinants of Health   Financial Resource Strain: Not on file  Food Insecurity: Not on file  Transportation Needs: Not on file  Physical Activity: Not on file  Stress: Not on file  Social Connections: Not on file     Allergies:  No Known Allergies  Metabolic Disorder Labs: Lab Results  Component Value Date   HGBA1C 5.6 01/16/2015   No results found for: PROLACTIN No results found for: CHOL, TRIG, HDL, CHOLHDL, VLDL, LDLCALC Lab Results  Component Value Date   TSH 1.320 01/16/2015    Therapeutic Level Labs: No results found for: LITHIUM No results found for: CBMZ No results found for: VALPROATE  Current Medications: Current Outpatient Medications  Medication Sig Dispense Refill   lurasidone (LATUDA) 20 MG TABS tablet Take 1 tablet (20 mg total) by mouth daily. 30 tablet 0   traZODone (DESYREL) 50 MG tablet Take 1 tablet (50 mg total) by mouth at bedtime. 30 tablet 0   fluconazole (DIFLUCAN) 150 MG tablet Take one tablet at the onset of symptoms. If symptoms are still present 3 days later, take the second tablet. 2 tablet 0   lamoTRIgine (LAMICTAL) 25 MG tablet Take 3 tablets (75 mg total) by mouth daily. 90 tablet 0   meloxicam (MOBIC) 15 MG tablet Take 1 tablet (15 mg total) by mouth daily. 30 tablet 0   naproxen (NAPROSYN) 500 MG tablet Take 1 tablet (500 mg total) by mouth 2 (two) times daily. 30 tablet 0   trimethoprim-polymyxin b (POLYTRIM) ophthalmic solution Place 2 drops into the right ear every 4 (four) hours. 10 mL 0   No current facility-administered medications for this visit.    Psychiatric Specialty Exam: Review of Systems  Cardiovascular:  Negative for chest pain.  Psychiatric/Behavioral:  Negative for self-injury.    unknown if currently  breastfeeding.There is no height or weight on file to calculate BMI.  General Appearance: Casual  Eye Contact:  Good  Speech:  Normal Rate  Volume:   normal  Mood:   subdued  Affect:  Appropriate  Thought Process:  Goal Directed  Orientation:  Full (Time, Place, and Person)  Thought Content:  Rumination  Suicidal Thoughts:  No  Homicidal Thoughts:  No  Memory:  Immediate;   Fair Recent;   Fair  Judgement:  Fair  Insight:  Shallow  Psychomotor Activity:  Normal  Concentration:  Concentration: Fair and Attention Span: Fair  Recall:  [employer] of Knowledge:Good  Language: Good  Akathisia:  No  Handed:    AIMS (if indicated):  not done  Assets:  Desire for Improvement  Housing Physical Health Social Support  ADL's:  Intact  Cognition: WNL  Sleep:  Fair   Screenings: PHQ2-9    Flowsheet Row Video Visit from 11/16/2020 in BE[facility] AT [place]  PHQ-2 Total Score 2  PHQ-9 Total Score 12      Flowsheet Row Video Visit from 12/19/2020 in BEHAV[facility]AL [facility]ALTH OUTPATIENT CENTER AT [place] Video Visit from 11/16/2020 in BEHAVIORAL HEALTH OUTPATIENT CENTER AT [place]  C-SSRS RISK CATEGORY No Risk No Risk       Assessment and Plan: as follows  Bipolar disorder current episode depressed; somewhat subdued or mood symptoms increase Lamictal to 75 mg add small dose of Latuda that she has used before we will also add a small dose of 50 mg take half of the trazodone that is 25 mg only increase if she can tolerate 25 mg  follow back with her counseling with [facility] or [place] counseling's discussed to work on self-esteem coping skills and impulsivity  Generalized anxiety disorder; fluctuates we will work on managing symptoms above discussed coping skills and continue therapy avoid benzodiazepine as of now she is recovering from abstinence from alcohol  Borderline personality traits; continue therapy    Follow-up in 4 weeks or earlier if  needed  Thresa Ross, MD 7/13/20222:50 PM

## 2020-12-19 NOTE — Telephone Encounter (Signed)
Medication management - Telephone call with patient to review past medications tried to complete her prior authorization request for Latuda.  Patient reported she had also tried Abilify, Zyprexa, and Geodon in the past.  Prior authorization prepared and pending provider review to submit.  Patient agreed to call back 12/20/20 to see if may have been approved by then.

## 2020-12-20 NOTE — Telephone Encounter (Signed)
Faxed an waiting for a reply

## 2021-01-08 ENCOUNTER — Other Ambulatory Visit (HOSPITAL_COMMUNITY): Payer: Self-pay | Admitting: Psychiatry

## 2021-01-10 ENCOUNTER — Other Ambulatory Visit (HOSPITAL_COMMUNITY): Payer: Self-pay | Admitting: *Deleted

## 2021-01-10 ENCOUNTER — Other Ambulatory Visit (HOSPITAL_COMMUNITY): Payer: Self-pay | Admitting: Psychiatry

## 2021-01-10 MED ORDER — LURASIDONE HCL 20 MG PO TABS: 20.0000 mg | ORAL_TABLET | Freq: Every day | ORAL | 0 refills | Status: DC

## 2021-01-26 ENCOUNTER — Other Ambulatory Visit (HOSPITAL_COMMUNITY): Payer: Self-pay | Admitting: Psychiatry

## 2021-02-08 ENCOUNTER — Encounter (HOSPITAL_COMMUNITY): Payer: Self-pay | Admitting: Psychiatry

## 2021-02-08 ENCOUNTER — Telehealth (INDEPENDENT_AMBULATORY_CARE_PROVIDER_SITE_OTHER): Payer: Medicare HMO | Admitting: Psychiatry

## 2021-02-08 DIAGNOSIS — F3130 Bipolar disorder, current episode depressed, mild or moderate severity, unspecified: Secondary | ICD-10-CM

## 2021-02-08 DIAGNOSIS — F411 Generalized anxiety disorder: Secondary | ICD-10-CM | POA: Diagnosis not present

## 2021-02-08 DIAGNOSIS — Z8659 Personal history of other mental and behavioral disorders: Secondary | ICD-10-CM | POA: Diagnosis not present

## 2021-02-08 MED ORDER — LURASIDONE HCL 20 MG PO TABS: 20.0000 mg | ORAL_TABLET | Freq: Every day | ORAL | 0 refills | Status: DC

## 2021-02-08 MED ORDER — CLONAZEPAM 0.5 MG PO TABS: 0.2500 mg | ORAL_TABLET | Freq: Every day | ORAL | 0 refills | Status: DC | PRN

## 2021-02-08 MED ORDER — LAMOTRIGINE 100 MG PO TABS: 100.0000 mg | ORAL_TABLET | Freq: Every day | ORAL | 0 refills | Status: DC

## 2021-02-08 NOTE — Progress Notes (Signed)
[facility] Follow up visit  Patient Identification: Lori Hahn MRN:  [id number] Date of Evaluation:  02/08/2021 Referral Source: primary care Chief Complaint:  follow up  mood symptoms  Visit Diagnosis:    ICD-10-CM   1. Bipolar I disorder, most recent episode depressed (HCC)  F31.30     2. GAD (generalized anxiety disorder)  F41.1     3. H/O borderline personality disorder  Z86.59      Virtual Visit via Video Note  I connected with Lori Hahn on 02/08/21 at 12:00 PM EDT by a video enabled telemedicine application and verified that I am speaking with the correct person using two identifiers.  Location: Patient: home Provider: home office   I discussed the limitations of evaluation and management by telemedicine and the availability of in person appointments. The patient expressed understanding and agreed to proceed.     I discussed the assessment and treatment plan with the patient. The patient was provided an opportunity to ask questions and all were answered. The patient agreed with the plan and demonstrated an understanding of the instructions.   The patient was advised to call back or seek an in-person evaluation if the symptoms worsen or if the condition fails to improve as anticipated.  I provided 15 minutes of non-face-to-face time during this encounter. Including documentation   History of Present Illness: Patient is a 40 years old currently single African-American female who has 4 kids.  Currently she is on disability for bipolar and borderline personality traits as stated by her.  She has seen a psychiatrist at [facility] in the past was not satisfied with services at [facility]  In the past she has been on Jordan, Lamictal, Klonopin recently she has not been on any medication for the last few months  Have started lamictal, latuda, it has helped her mood, less edgy Still endorses anxiety wants to be back on klonopine, understands the risk of long term use and wants for  short term    Aggravating factors; harsh adopted mom. Dad's death  of kidney failure last.  She was close to him and going thru grief, relationship can be edgy Modifying factors; BF, kids    Past medication use has been Seroquel, Paxil, Geodon, Abilify  Medication that has helped Jordan, Lamictal, Klonopin   Past Psychiatric History: borderline personality , bipolar disorder  Previous Psychotropic Medications: Yes   Substance Abuse History in the last 12 months:  Yes.    Consequences of Substance Abuse: Discussed effects of alcohol on medications, judjement, depression  Past Medical History:  Past Medical History:  Diagnosis Date   Abnormal Pap smear of cervix    has colpo   Anemia    Depression     Past Surgical History:  Procedure Laterality Date   COLPOSCOPY  2001   dysplasia   TONSILLECTOMY     UMBILICAL HERNIA REPAIR  07/2012    Family Psychiatric History: adapted   Family History:  Family History  Adopted: Yes    Social History:   Social History   Socioeconomic History   Marital status: Single    Spouse name: Not on file   Number of children: Not on file   Years of education: Not on file   Highest education level: Not on file  Occupational History   Not on file  Tobacco Use   Smoking status: Former    Types: Cigarettes    Quit date: 06/16/2013    Years since quitting: 7.6   Smokeless tobacco: Never  Substance and Sexual Activity   Alcohol use: Yes    Comment: occ   Drug use: No   Sexual activity: Yes    Birth control/protection: None, Condom  Other Topics Concern   Not on file  Social History Narrative   Not on file   Social Determinants of Health   Financial Resource Strain: Not on file  Food Insecurity: Not on file  Transportation Needs: Not on file  Physical Activity: Not on file  Stress: Not on file  Social Connections: Not on file     Allergies:  No Known Allergies  Metabolic Disorder Labs: Lab Results  Component Value  Date   HGBA1C 5.6 01/16/2015   No results found for: PROLACTIN No results found for: CHOL, TRIG, HDL, CHOLHDL, VLDL, LDLCALC Lab Results  Component Value Date   TSH 1.320 01/16/2015    Therapeutic Level Labs: No results found for: LITHIUM No results found for: CBMZ No results found for: VALPROATE  Current Medications: Current Outpatient Medications  Medication Sig Dispense Refill   clonazePAM (KLONOPIN) 0.5 MG tablet Take 0.5 tablets (0.25 mg total) by mouth daily as needed for anxiety. 15 tablet 0   fluconazole (DIFLUCAN) 150 MG tablet Take one tablet at the onset of symptoms. If symptoms are still present 3 days later, take the second tablet. 2 tablet 0   lamoTRIgine (LAMICTAL) 100 MG tablet Take 1 tablet (100 mg total) by mouth daily. 30 tablet 0   lurasidone (LATUDA) 20 MG TABS tablet Take 1 tablet (20 mg total) by mouth daily. 30 tablet 0   meloxicam (MOBIC) 15 MG tablet Take 1 tablet (15 mg total) by mouth daily. 30 tablet 0   naproxen (NAPROSYN) 500 MG tablet Take 1 tablet (500 mg total) by mouth 2 (two) times daily. 30 tablet 0   traZODone (DESYREL) 50 MG tablet TAKE 1 TABLET BY MOUTH EVERYDAY AT BEDTIME 90 tablet 1   trimethoprim-polymyxin b (POLYTRIM) ophthalmic solution Place 2 drops into the right ear every 4 (four) hours. 10 mL 0   No current facility-administered medications for this visit.    Psychiatric Specialty Exam: Review of Systems  Cardiovascular:  Negative for chest pain.  Psychiatric/Behavioral:  Negative for self-injury.    unknown if currently breastfeeding.There is no height or weight on file to calculate BMI.  General Appearance: Casual  Eye Contact:  Good  Speech:  Normal Rate  Volume:   normal  Mood:  stressed  Affect:  Appropriate  Thought Process:  Goal Directed  Orientation:  Full (Time, Place, and Person)  Thought Content:  Rumination  Suicidal Thoughts:  No  Homicidal Thoughts:  No  Memory:  Immediate;   Fair Recent;   Fair   Judgement:  Fair  Insight:  Shallow  Psychomotor Activity:  Normal  Concentration:  Concentration: Fair and Attention Span: Fair  Recall:  Fair  Fund of Knowledge:Good  Language: Good  Akathisia:  No  Handed:    AIMS (if indicated):  not done  Assets:  Desire for Improvement Housing Physical Health Social Support  ADL's:  Intact  Cognition: WNL  Sleep:  Fair   Screenings: PHQ2-9    Flowsheet Row Video Visit from 11/16/2020 in BEHAVIORAL HEALTH OUTPATIENT CENTER AT [place]  PHQ-2 Total Score 2  PHQ-9 Total Score 12      Flowsheet Row Video Visit from 02/08/2021 in BEHAVIORAL HEALTH OUTPATIENT CENTER AT [place] Video Visit from 12/19/2020 in BEHAVIORAL HEALTH OUTPATIENT CENTER AT [place] Video Visit from 11/16/2020 in BEHAVIORAL  HEALTH OUTPATIENT CENTER AT [place]  C-SSRS RISK CATEGORY No Risk No Risk No Risk       Assessment and Plan: as follows  Bipolar disorder current episode depressed; depression, mood better continue latuda, lamictal. No rash or tremors  follow back with her counseling with [place] or [place] counseling's discussed to work on self-esteem coping skills and impulsivity  Generalized anxiety disorder; gets anxious, says other meds doesn't work, wants to be on klonopine, understands the risk will start for 2 weeks Continue working on distraction techniques and therapy  Borderline personality traits; continue therapy    Follow-up in 4 weeks or earlier if needed  Thresa Ross, MD 9/2/202212:27 PM

## 2021-02-26 ENCOUNTER — Other Ambulatory Visit (HOSPITAL_COMMUNITY): Payer: Self-pay | Admitting: Psychiatry

## 2021-03-02 ENCOUNTER — Other Ambulatory Visit (HOSPITAL_COMMUNITY): Payer: Self-pay | Admitting: Psychiatry

## 2021-03-17 ENCOUNTER — Other Ambulatory Visit (HOSPITAL_COMMUNITY): Payer: Self-pay | Admitting: Psychiatry

## 2021-03-29 ENCOUNTER — Other Ambulatory Visit: Payer: Self-pay

## 2021-03-29 ENCOUNTER — Telehealth (HOSPITAL_COMMUNITY): Payer: Medicare HMO | Admitting: Psychiatry

## 2021-04-01 ENCOUNTER — Other Ambulatory Visit (HOSPITAL_COMMUNITY): Payer: Self-pay | Admitting: Psychiatry

## 2021-04-17 ENCOUNTER — Other Ambulatory Visit (HOSPITAL_COMMUNITY): Payer: Self-pay | Admitting: Psychiatry

## 2021-04-24 ENCOUNTER — Other Ambulatory Visit (HOSPITAL_COMMUNITY): Payer: Self-pay | Admitting: Psychiatry

## 2021-05-20 ENCOUNTER — Encounter (HOSPITAL_COMMUNITY): Payer: Self-pay | Admitting: Psychiatry

## 2021-05-20 ENCOUNTER — Telehealth (INDEPENDENT_AMBULATORY_CARE_PROVIDER_SITE_OTHER): Payer: Medicare HMO | Admitting: Psychiatry

## 2021-05-20 DIAGNOSIS — F3130 Bipolar disorder, current episode depressed, mild or moderate severity, unspecified: Secondary | ICD-10-CM

## 2021-05-20 DIAGNOSIS — F411 Generalized anxiety disorder: Secondary | ICD-10-CM | POA: Diagnosis not present

## 2021-05-20 DIAGNOSIS — Z8659 Personal history of other mental and behavioral disorders: Secondary | ICD-10-CM

## 2021-05-20 MED ORDER — LAMOTRIGINE 100 MG PO TABS: 100.0000 mg | ORAL_TABLET | Freq: Every day | ORAL | 1 refills | Status: DC

## 2021-05-20 MED ORDER — CLONAZEPAM 0.5 MG PO TABS: 0.2500 mg | ORAL_TABLET | Freq: Every day | ORAL | 0 refills | Status: DC | PRN

## 2021-05-20 MED ORDER — LATUDA 20 MG PO TABS: 20.0000 mg | ORAL_TABLET | Freq: Every day | ORAL | 0 refills | Status: DC

## 2021-05-20 NOTE — Progress Notes (Signed)
[facility] Follow up visit  Patient Identification: Lori Hahn MRN:  [id number] Date of Evaluation:  05/20/2021 Referral Source: primary care Chief Complaint:  follow up  mood symptoms  Visit Diagnosis:    ICD-10-CM   1. Bipolar I disorder, most recent episode depressed (HCC)  F31.30     2. GAD (generalized anxiety disorder)  F41.1     3. H/O borderline personality disorder  Z86.59      Virtual Visit via Video Note  I connected with Lori Hahn on 05/20/21 at  2:15 PM EST by a video enabled telemedicine application and verified that I am speaking with the correct person using two identifiers.  Location: Patient: home Provider: home office   I discussed the limitations of evaluation and management by telemedicine and the availability of in person appointments. The patient expressed understanding and agreed to proceed.      I discussed the assessment and treatment plan with the patient. The patient was provided an opportunity to ask questions and all were answered. The patient agreed with the plan and demonstrated an understanding of the instructions.   The patient was advised to call back or seek an in-person evaluation if the symptoms worsen or if the condition fails to improve as anticipated.  I provided 20 plus minutes of non-face-to-face time during this encounter, including chart review, med adjustment and documentation   History of Present Illness: Patient is a 40 years old currently single African-American female who has 4 kids.  Currently she is on disability for bipolar and borderline personality traits as stated by her.  She has seen a psychiatrist at [facility] in the past was not satisfied with services at [facility]  In the past she has been on Jordan, Lamictal, Klonopin recently she has not been on any medication for the last few months  Last time was doing fair, but has been a NS not seen for many months States was feeling subdued, had death in family and also got off  from therapy  Says dealing with dad/s death and grief Dealing with kids   Feeling subdued and has been off latuda for more then 2 weeks Relationsip is going fair but gets depressed and edgy, anxious wihtout klonopine now   Aggravating factors; harsh adapted mom. Dad's death  of kidney failure last.  She was close to him and going thru grief, relationship can be edgy Modifying factors; BF, kids    Past medication use has been Seroquel, Paxil, Geodon, Abilify  Medication that has helped Jordan, Lamictal, Klonopin   Past Psychiatric History: borderline personality , bipolar disorder  Previous Psychotropic Medications: Yes   Substance Abuse History in the last 12 months:  Yes.    Consequences of Substance Abuse: Discussed effects of alcohol on medications, judjement, depression  Past Medical History:  Past Medical History:  Diagnosis Date   Abnormal Pap smear of cervix    has colpo   Anemia    Depression     Past Surgical History:  Procedure Laterality Date   COLPOSCOPY  2001   dysplasia   TONSILLECTOMY     UMBILICAL HERNIA REPAIR  07/2012    Family Psychiatric History: adapted   Family History:  Family History  Adopted: Yes    Social History:   Social History   Socioeconomic History   Marital status: Single    Spouse name: Not on file   Number of children: Not on file   Years of education: Not on file   Highest education level:  Not on file  Occupational History   Not on file  Tobacco Use   Smoking status: Former    Types: Cigarettes    Quit date: 06/16/2013    Years since quitting: 7.9   Smokeless tobacco: Never  Substance and Sexual Activity   Alcohol use: Yes    Comment: occ   Drug use: No   Sexual activity: Yes    Birth control/protection: None, Condom  Other Topics Concern   Not on file  Social History Narrative   Not on file   Social Determinants of Health   Financial Resource Strain: Not on file  Food Insecurity: Not on file   Transportation Needs: Not on file  Physical Activity: Not on file  Stress: Not on file  Social Connections: Not on file     Allergies:  No Known Allergies  Metabolic Disorder Labs: Lab Results  Component Value Date   HGBA1C 5.6 01/16/2015   No results found for: PROLACTIN No results found for: CHOL, TRIG, HDL, CHOLHDL, VLDL, LDLCALC Lab Results  Component Value Date   TSH 1.320 01/16/2015    Therapeutic Level Labs: No results found for: LITHIUM No results found for: CBMZ No results found for: VALPROATE  Current Medications: Current Outpatient Medications  Medication Sig Dispense Refill   clonazePAM (KLONOPIN) 0.5 MG tablet Take 0.5 tablets (0.25 mg total) by mouth daily as needed for anxiety. 15 tablet 0   fluconazole (DIFLUCAN) 150 MG tablet Take one tablet at the onset of symptoms. If symptoms are still present 3 days later, take the second tablet. 2 tablet 0   lamoTRIgine (LAMICTAL) 100 MG tablet TAKE 1 TABLET BY MOUTH EVERY DAY 30 tablet 0   lamoTRIgine (LAMICTAL) 100 MG tablet Take 1 tablet (100 mg total) by mouth daily. 30 tablet 1   lurasidone (LATUDA) 20 MG TABS tablet Take 1 tablet (20 mg total) by mouth daily. Take one a day for 4 days and then 2  a day. Total dose is 40mg  30 tablet 0   meloxicam (MOBIC) 15 MG tablet Take 1 tablet (15 mg total) by mouth daily. 30 tablet 0   naproxen (NAPROSYN) 500 MG tablet Take 1 tablet (500 mg total) by mouth 2 (two) times daily. 30 tablet 0   traZODone (DESYREL) 50 MG tablet TAKE 1 TABLET BY MOUTH EVERYDAY AT BEDTIME 90 tablet 1   trimethoprim-polymyxin b (POLYTRIM) ophthalmic solution Place 2 drops into the right ear every 4 (four) hours. 10 mL 0   No current facility-administered medications for this visit.    Psychiatric Specialty Exam: Review of Systems  Cardiovascular:  Negative for chest pain.  Psychiatric/Behavioral:  Positive for dysphoric mood. Negative for self-injury.    unknown if currently breastfeeding.There  is no height or weight on file to calculate BMI.  General Appearance: Casual  Eye Contact:  Good  Speech:  Normal Rate  Volume:   normal  Mood: subdued  Affect:  Appropriate but anxious  Thought Process:  Goal Directed  Orientation:  Full (Time, Place, and Person)  Thought Content:  Rumination  Suicidal Thoughts:  No  Homicidal Thoughts:  No  Memory:  Immediate;   Fair Recent;   Fair  Judgement:  Fair  Insight:  Shallow  Psychomotor Activity:  Normal  Concentration:  Concentration: Fair and Attention Span: Fair  Recall:  of Knowledge:Good  Language: Good  Akathisia:  No  Handed:    AIMS (if indicated):  not done  Assets:  Desire for  Improvement Housing Physical Health Social Support  ADL's:  Intact  Cognition: WNL  Sleep:  Fair   Screenings: PHQ2-9    Flowsheet Row Video Visit from 11/16/2020 in BEHAVIORAL HEALTH OUTPATIENT CENTER AT [place]  PHQ-2 Total Score 2  PHQ-9 Total Score 12      Flowsheet Row Video Visit from 05/20/2021 in BEHAVIORAL HEALTH OUTPATIENT CENTER AT [place] Video Visit from 02/08/2021 in BEHAVIORAL HEALTH OUTPATIENT CENTER AT [place] Video Visit from 12/19/2020 in BEHAVIORAL HEALTH OUTPATIENT CENTER AT [place]  C-SSRS RISK CATEGORY No Risk No Risk No Risk       Assessment and Plan: as follows  Prior documentation reveiewed  Bipolar disorder current episode depressed; depressed, continue lamictal, restart latuda 20mg  and increase to 40mg  in 4 days  Schedule and encouraged for counselling  Generalized anxiety disorder; feels anxious aorund holidays and dealing with stress, wants klonopine back as other meds dont work, will start low dose 15 tabs a month  Borderline personality traits; not impulsive, but subdued discussed distraction from negatie thouths and engage in activities   Fu 4 -6 weeks Meds discussed, renewed    , MD 12/12/20222:33 PM

## 2021-05-22 ENCOUNTER — Other Ambulatory Visit (HOSPITAL_COMMUNITY): Payer: Self-pay | Admitting: Psychiatry

## 2021-06-11 ENCOUNTER — Other Ambulatory Visit (HOSPITAL_COMMUNITY): Payer: Self-pay | Admitting: Psychiatry

## 2021-06-12 ENCOUNTER — Telehealth (HOSPITAL_COMMUNITY): Payer: Self-pay | Admitting: Psychiatry

## 2021-06-12 MED ORDER — LURASIDONE HCL 40 MG PO TABS: 40.0000 mg | ORAL_TABLET | Freq: Every day | ORAL | 0 refills | Status: DC

## 2021-06-12 MED ORDER — LAMOTRIGINE 100 MG PO TABS: 100.0000 mg | ORAL_TABLET | Freq: Every day | ORAL | 1 refills | Status: DC

## 2021-06-12 MED ORDER — CLONAZEPAM 0.5 MG PO TABS: 0.2500 mg | ORAL_TABLET | Freq: Every day | ORAL | 0 refills | Status: DC | PRN

## 2021-06-12 NOTE — Telephone Encounter (Signed)
Refill: lurasidone (LATUDA) 40 MG TABS tablet   - Pt said Dr. Gilmore Laroche said he was going to start her on 40MG  - she has been taking 20MG    lamoTRIgine (LAMICTAL) 100 MG tablet  clonazePAM (KLONOPIN) 0.5 MG tablet   Send To:  [id number] - [place], [place] - [address]

## 2021-06-26 ENCOUNTER — Encounter (HOSPITAL_COMMUNITY): Payer: Self-pay | Admitting: Psychiatry

## 2021-06-26 ENCOUNTER — Telehealth (HOSPITAL_COMMUNITY): Payer: Self-pay | Admitting: Psychiatry

## 2021-06-26 NOTE — Telephone Encounter (Signed)
Letter is in your box to be signed.

## 2021-06-26 NOTE — Telephone Encounter (Signed)
Pt needs a letter for school to get accommodations    Need to know her disability, meds and how it affects her    New # [phone]

## 2021-07-01 ENCOUNTER — Telehealth (HOSPITAL_COMMUNITY): Payer: Medicare HMO | Admitting: Psychiatry

## 2021-07-05 ENCOUNTER — Telehealth (HOSPITAL_COMMUNITY): Payer: Medicare HMO | Admitting: Psychiatry

## 2021-07-11 ENCOUNTER — Other Ambulatory Visit (HOSPITAL_COMMUNITY): Payer: Self-pay | Admitting: Psychiatry

## 2021-07-22 ENCOUNTER — Other Ambulatory Visit (HOSPITAL_COMMUNITY): Payer: Self-pay

## 2021-07-22 MED ORDER — LAMOTRIGINE 100 MG PO TABS: 100.0000 mg | ORAL_TABLET | Freq: Every day | ORAL | 0 refills | Status: DC

## 2021-08-26 ENCOUNTER — Encounter (HOSPITAL_COMMUNITY): Payer: Self-pay | Admitting: Psychiatry

## 2021-08-26 ENCOUNTER — Telehealth (INDEPENDENT_AMBULATORY_CARE_PROVIDER_SITE_OTHER): Payer: Medicare HMO | Admitting: Psychiatry

## 2021-08-26 DIAGNOSIS — Z8659 Personal history of other mental and behavioral disorders: Secondary | ICD-10-CM | POA: Diagnosis not present

## 2021-08-26 DIAGNOSIS — F3130 Bipolar disorder, current episode depressed, mild or moderate severity, unspecified: Secondary | ICD-10-CM

## 2021-08-26 DIAGNOSIS — F411 Generalized anxiety disorder: Secondary | ICD-10-CM | POA: Diagnosis not present

## 2021-08-26 MED ORDER — LAMOTRIGINE 25 MG PO TABS: 25.0000 mg | ORAL_TABLET | Freq: Every day | ORAL | 0 refills | Status: DC

## 2021-08-26 MED ORDER — LURASIDONE HCL 20 MG PO TABS: 20.0000 mg | ORAL_TABLET | Freq: Every day | ORAL | 0 refills | Status: DC

## 2021-08-26 NOTE — Progress Notes (Signed)
[facility] Follow up visit ? ?Patient Identification: Lori Hahn ?MRN:  [id number] ?Date of Evaluation:  08/26/2021 ?Referral Source: primary care ?Chief Complaint:  follow up  mood symptoms  ?Visit Diagnosis:  ?  ICD-10-CM   ?1. Bipolar I disorder, most recent episode depressed (HCC)  F31.30   ?  ?2. GAD (generalized anxiety disorder)  F41.1   ?  ?3. H/O borderline personality disorder  Z86.59   ?  ? ?Virtual Visit via Video Note ? ?I connected with Lori Hahn on 08/26/21 at  4:30 PM EDT by a video enabled telemedicine application and verified that I am speaking with the correct person using two identifiers. ? ?Location: ?Patient: HOME ?Provider: home office ?  ?I discussed the limitations of evaluation and management by telemedicine and the availability of in person appointments. The patient expressed understanding and agreed to proceed. ? ? ? ?  ?I discussed the assessment and treatment plan with the patient. The patient was provided an opportunity to ask questions and all were answered. The patient agreed with the plan and demonstrated an understanding of the instructions. ?  ?The patient was advised to call back or seek an in-person evaluation if the symptoms worsen or if the condition fails to improve as anticipated. ? ?I provided 20 plus minutes of non-face-to-face time during this encounter, including chart review, med adjustment and documentation ? ? ?History of Present Illness: Patient is a 41 years old currently single African-American female who has 4 kids.  Currently she is on disability for bipolar and borderline personality traits as stated by her.  She has seen a psychiatrist at [facility] in the past was not satisfied with services at [facility] ? ?Patient last seen and restarted meds, discussed compliance but has been a NS ?Now back running off lamictal  she does better when on meds ? Feeling anxious, subdued and feels mind is racing, took moms ativan  ? ? ? ?Says dealing with dad/s death and  grief ?Dealing with kids  ?Gets stressed and distracted ? ? ?Aggravating factors; harsh adapted mom. Dad's death of kidney failure last.  She was close to him and going thru grief, relationship can be edgy ?Modifying factors;BF, kids ? ?Severity : subdued,  ? ?Past medication use has been Seroquel, Paxil, Geodon, Abilify ? ?Medication that has helped Latuda, Lamictal, Klonopin ? ? ?Past Psychiatric History: borderline personality , bipolar disorder ? ?Previous Psychotropic Medications: Yes  ? ?Substance Abuse History in the last 12 months:  Yes.   ? ?Consequences of Substance Abuse: ?Discussed effects of alcohol on medications, judjement, depression ? ?Past Medical History:  ?Past Medical History:  ?Diagnosis Date  ? Abnormal Pap smear of cervix   ? has colpo  ? Anemia   ? Depression   ?  ?Past Surgical History:  ?Procedure Laterality Date  ? COLPOSCOPY  2001  ? dysplasia  ? TONSILLECTOMY    ? UMBILICAL HERNIA REPAIR  07/2012  ? ? ?Family Psychiatric History: adapted  ? ?Family History:  ?Family History  ?Adopted: Yes  ? ? ?Social History:   ?Social History  ? ?Socioeconomic History  ? Marital status: Single  ?  Spouse name: Not on file  ? Number of children: Not on file  ? Years of education: Not on file  ? Highest education level: Not on file  ?Occupational History  ? Not on file  ?Tobacco Use  ? Smoking status: Former  ?  Types: Cigarettes  ?  Quit date: 06/16/2013  ?  Years  since quitting: 8.2  ? Smokeless tobacco: Never  ?Substance and Sexual Activity  ? Alcohol use: Yes  ?  Comment: occ  ? Drug use: No  ? Sexual activity: Yes  ?  Birth control/protection: None, Condom  ?Other Topics Concern  ? Not on file  ?Social History Narrative  ? Not on file  ? ?Social Determinants of Health  ? ?Financial Resource Strain: Not on file  ?Food Insecurity: Not on file  ?Transportation Needs: Not on file  ?Physical Activity: Not on file  ?Stress: Not on file  ?Social Connections: Not on file  ? ? ? ?Allergies:  No Known  Allergies ? ?Metabolic Disorder Labs: ?Lab Results  ?Component Value Date  ? HGBA1C 5.6 01/16/2015  ? ?No results found for: PROLACTIN ?No results found for: CHOL, TRIG, HDL, CHOLHDL, VLDL, LDLCALC ?Lab Results  ?Component Value Date  ? TSH 1.320 01/16/2015  ? ? ?Therapeutic Level Labs: ?No results found for: LITHIUM ?No results found for: CBMZ ?No results found for: VALPROATE ? ?Current Medications: ?Current Outpatient Medications  ?Medication Sig Dispense Refill  ? lamoTRIgine (LAMICTAL) 25 MG tablet Take 1 tablet (25 mg total) by mouth daily. Take one tablet daily for a week and then start taking 2 tablets. 60 tablet 0  ? clonazePAM (KLONOPIN) 0.5 MG tablet Take 0.5 tablets (0.25 mg total) by mouth daily as needed for anxiety. 15 tablet 0  ? fluconazole (DIFLUCAN) 150 MG tablet Take one tablet at the onset of symptoms. If symptoms are still present 3 days later, take the second tablet. 2 tablet 0  ? lamoTRIgine (LAMICTAL) 100 MG tablet Take 1 tablet (100 mg total) by mouth daily. 90 tablet 0  ? lurasidone (LATUDA) 20 MG TABS tablet Take 1 tablet (20 mg total) by mouth daily. Take one a day for 4 days and then 2  a day. Total dose is 40mg  30 tablet 0  ? meloxicam (MOBIC) 15 MG tablet Take 1 tablet (15 mg total) by mouth daily. 30 tablet 0  ? naproxen (NAPROSYN) 500 MG tablet Take 1 tablet (500 mg total) by mouth 2 (two) times daily. 30 tablet 0  ? trimethoprim-polymyxin b (POLYTRIM) ophthalmic solution Place 2 drops into the right ear every 4 (four) hours. 10 mL 0  ? ?No current facility-administered medications for this visit.  ? ? ?Psychiatric Specialty Exam: ?Review of Systems  ?Cardiovascular:  Negative for chest pain.  ?Psychiatric/Behavioral:  Positive for dysphoric mood. Negative for self-injury. The patient is nervous/anxious.    ?unknown if currently breastfeeding.There is no height or weight on file to calculate BMI.  ?General Appearance: Casual  ?Eye Contact:  Good  ?Speech:  Normal Rate  ?Volume:    normal  ?Mood:  subdued  ?Affect:  Appropriate but anxious  ?Thought Process:  Goal Directed  ?Orientation:  Full (Time, Place, and Person)  ?Thought Content:  Rumination  ?Suicidal Thoughts:  No  ?Homicidal Thoughts:  No  ?Memory:  Immediate;   Fair ?Recent;   Fair  ?Judgement:  Fair  ?Insight:  Shallow  ?Psychomotor Activity:  Normal  ?Concentration:  Concentration: Fair and Attention Span: Fair  ?Recall:  Fair  ?Fund of Knowledge:Good  ?Language: Good  ?Akathisia:  No  ?Handed:    ?AIMS (if indicated):  not done  ?Assets:  Desire for Improvement ?Housing ?Physical Health ?Social Support  ?ADL's:  Intact  ?Cognition: WNL  ?Sleep:  Fair  ? ?Screenings: ?PHQ2-9   ? ?Flowsheet Row Video Visit from 11/16/2020 in BEHAVIORAL  HEALTH OUTPATIENT CENTER AT [place]  ?PHQ-2 Total Score 2  ?PHQ-9 Total Score 12  ? ?  ? ?Flowsheet Row Video Visit from 08/26/2021 in BEHAVIORAL HEALTH OUTPATIENT CENTER AT [place] Video Visit from 05/20/2021 in BEHAVIORAL HEALTH OUTPATIENT CENTER AT [place] Video Visit from 02/08/2021 in BEHAVIORAL HEALTH OUTPATIENT CENTER AT [place]  ?C-SSRS RISK CATEGORY No Risk No Risk No Risk  ? ?  ? ? ?Assessment and Plan: as follows ? ?Prior documentation reviewed ? ? ?Bipolar disorder current episode depressed; depressed, restart lamictal at 25mg  she can call in 3 weeks to get the 100mg  ,  ?Restart latuda 20mg  and increase to 40mg  in one week ?Did well with this combination before ? ? ?Generalized anxiety disorder; feels anxious, restart low dose klonopine for short term ?Consider therapy ? ? ?Borderline personality traits; not impulsive, but non compliant, discussed coping skills, consider therapy ? ?Fu 4 -6 weeks ?Meds discussed, renewed ? ? ? ? , MD ?3/20/20234:43 PM ? ?

## 2021-08-27 ENCOUNTER — Telehealth (HOSPITAL_COMMUNITY): Payer: Self-pay | Admitting: Psychiatry

## 2021-08-27 MED ORDER — CLONAZEPAM 0.5 MG PO TABS: 0.2500 mg | ORAL_TABLET | Freq: Every day | ORAL | 0 refills | Status: DC | PRN

## 2021-08-27 NOTE — Telephone Encounter (Signed)
?  Refill ? ?clonazePAM (KLONOPIN) 0.5 MG tablet ? ? ?Send to ? ?[id number] - [place],  - [address] ?

## 2021-08-27 NOTE — Telephone Encounter (Signed)
Looks like Lamictal and Latuda was sent but not the Clonazepam.  ?

## 2021-09-18 ENCOUNTER — Other Ambulatory Visit (HOSPITAL_COMMUNITY): Payer: Self-pay | Admitting: Psychiatry

## 2021-09-18 ENCOUNTER — Telehealth (HOSPITAL_COMMUNITY): Payer: Self-pay | Admitting: Psychiatry

## 2021-09-18 NOTE — Telephone Encounter (Addendum)
Dr. Gilmore Laroche out of office on 09/27/21 ?Called pt - left vm 9:05am ?

## 2021-09-27 ENCOUNTER — Telehealth (HOSPITAL_COMMUNITY): Payer: Medicare HMO | Admitting: Psychiatry

## 2021-10-02 ENCOUNTER — Other Ambulatory Visit (HOSPITAL_COMMUNITY): Payer: Self-pay

## 2021-10-02 MED ORDER — LAMOTRIGINE 25 MG PO TABS: ORAL_TABLET | ORAL | 0 refills | Status: DC

## 2021-10-04 ENCOUNTER — Telehealth (HOSPITAL_COMMUNITY): Payer: Medicare HMO | Admitting: Psychiatry

## 2021-10-28 ENCOUNTER — Telehealth (INDEPENDENT_AMBULATORY_CARE_PROVIDER_SITE_OTHER): Payer: Medicare HMO | Admitting: Psychiatry

## 2021-10-28 ENCOUNTER — Encounter (HOSPITAL_COMMUNITY): Payer: Self-pay | Admitting: Psychiatry

## 2021-10-28 DIAGNOSIS — F3130 Bipolar disorder, current episode depressed, mild or moderate severity, unspecified: Secondary | ICD-10-CM

## 2021-10-28 DIAGNOSIS — Z8659 Personal history of other mental and behavioral disorders: Secondary | ICD-10-CM | POA: Diagnosis not present

## 2021-10-28 DIAGNOSIS — F411 Generalized anxiety disorder: Secondary | ICD-10-CM | POA: Diagnosis not present

## 2021-10-28 MED ORDER — LURASIDONE HCL 20 MG PO TABS: 20.0000 mg | ORAL_TABLET | Freq: Every day | ORAL | 0 refills | Status: DC

## 2021-10-28 MED ORDER — LAMOTRIGINE 100 MG PO TABS: 100.0000 mg | ORAL_TABLET | Freq: Every day | ORAL | 0 refills | Status: DC

## 2021-10-28 MED ORDER — CLONAZEPAM 0.5 MG PO TABS: 0.2500 mg | ORAL_TABLET | Freq: Every day | ORAL | 0 refills | Status: DC | PRN

## 2021-10-28 NOTE — Progress Notes (Signed)
[facility] Follow up visit  Patient Identification: Sibley Rolison MRN:  [id number] Date of Evaluation:  10/28/2021 Referral Source: primary care Chief Complaint:  follow up  mood symptoms  Visit Diagnosis:    ICD-10-CM   1. Bipolar I disorder, most recent episode depressed (HCC)  F31.30     2. GAD (generalized anxiety disorder)  F41.1     3. H/O borderline personality disorder  Z86.59      Virtual Visit via Video Note  I connected with Sefora Vergara on 10/28/21 at  1:30 PM EDT by a video enabled telemedicine application and verified that I am speaking with the correct person using two identifiers.  Location: Patient: home Provider: home office   I discussed the limitations of evaluation and management by telemedicine and the availability of in person appointments. The patient expressed understanding and agreed to proceed.      I discussed the assessment and treatment plan with the patient. The patient was provided an opportunity to ask questions and all were answered. The patient agreed with the plan and demonstrated an understanding of the instructions.   The patient was advised to call back or seek an in-person evaluation if the symptoms worsen or if the condition fails to improve as anticipated.  I provided 15 plus minutes of non-face-to-face time during this encounter, including chart review, med adjustment and documentation   History of Present Illness: Patient is a 41 years old currently single African-American female who has 4 kids.  Currently she is on disability for bipolar and borderline personality traits as stated by her.  She has seen a psychiatrist at [facility] in the past was not satisfied with services at [facility]   Has been doing fair, still needs klonopine for prn anxiety agitation At times can get moody but not as she has been years ago  Lamictal on 75mg , has not increased more yet  Says dealing with dad/s death and grief    Aggravating factors; harsh adapted  mom, Dad's death of kidney failure last.  She was close to him and going thru grief, relationship can be edgy Modifying factors;BF,kids  Severity : can get edgy  Past medication use has been Seroquel, Paxil, Geodon, Abilify  Medication that has helped , Lamictal, Klonopin   Past Psychiatric History: borderline personality , bipolar disorder  Previous Psychotropic Medications: Yes   Substance Abuse History in the last 12 months:  Yes.    Consequences of Substance Abuse: Discussed effects of alcohol on medications, judjement, depression  Past Medical History:  Past Medical History:  Diagnosis Date   Abnormal Pap smear of cervix    has colpo   Anemia    Depression     Past Surgical History:  Procedure Laterality Date   COLPOSCOPY  2001   dysplasia   TONSILLECTOMY     UMBILICAL HERNIA REPAIR  07/2012    Family Psychiatric History: adapted   Family History:  Family History  Adopted: Yes    Social History:   Social History   Socioeconomic History   Marital status: Single    Spouse name: Not on file   Number of children: Not on file   Years of education: Not on file   Highest education level: Not on file  Occupational History   Not on file  Tobacco Use   Smoking status: Former    Types: Cigarettes    Quit date: 06/16/2013    Years since quitting: 8.3   Smokeless tobacco: Never  Substance and Sexual Activity  Alcohol use: Yes    Comment: occ   Drug use: No   Sexual activity: Yes    Birth control/protection: None, Condom  Other Topics Concern   Not on file  Social History Narrative   Not on file   Social Determinants of Health   Financial Resource Strain: Not on file  Food Insecurity: Not on file  Transportation Needs: Not on file  Physical Activity: Not on file  Stress: Not on file  Social Connections: Not on file     Allergies:  No Known Allergies  Metabolic Disorder Labs: Lab Results  Component Value Date   HGBA1C 5.6 01/16/2015    No results found for: PROLACTIN No results found for: CHOL, TRIG, HDL, CHOLHDL, VLDL, LDLCALC Lab Results  Component Value Date   TSH 1.320 01/16/2015    Therapeutic Level Labs: No results found for: LITHIUM No results found for: CBMZ No results found for: VALPROATE  Current Medications: Current Outpatient Medications  Medication Sig Dispense Refill   clonazePAM (KLONOPIN) 0.5 MG tablet Take 0.5 tablets (0.25 mg total) by mouth daily as needed for anxiety. 15 tablet 0   fluconazole (DIFLUCAN) 150 MG tablet Take one tablet at the onset of symptoms. If symptoms are still present 3 days later, take the second tablet. 2 tablet 0   lamoTRIgine (LAMICTAL) 100 MG tablet Take 1 tablet (100 mg total) by mouth daily. 90 tablet 0   lurasidone (LATUDA) 20 MG TABS tablet Take 1 tablet (20 mg total) by mouth daily. Take one a day for 4 days and then 2  a day. Total dose is 40mg  30 tablet 0   meloxicam (MOBIC) 15 MG tablet Take 1 tablet (15 mg total) by mouth daily. 30 tablet 0   naproxen (NAPROSYN) 500 MG tablet Take 1 tablet (500 mg total) by mouth 2 (two) times daily. 30 tablet 0   trimethoprim-polymyxin b (POLYTRIM) ophthalmic solution Place 2 drops into the right ear every 4 (four) hours. 10 mL 0   No current facility-administered medications for this visit.    Psychiatric Specialty Exam: Review of Systems  Cardiovascular:  Negative for chest pain.  Neurological:  Negative for tremors.  Psychiatric/Behavioral:  Negative for self-injury.    unknown if currently breastfeeding.There is no height or weight on file to calculate BMI.  General Appearance: Casual  Eye Contact:  Good  Speech:  Normal Rate  Volume:   normal  Mood:  somewhat subdued  Affect:  Appropriate   Thought Process:  Goal Directed  Orientation:  Full (Time, Place, and Person)  Thought Content:  Rumination  Suicidal Thoughts:  No  Homicidal Thoughts:  No  Memory:  Immediate;   Fair Recent;   Fair  Judgement:  Fair   Insight:  Shallow  Psychomotor Activity:  Normal  Concentration:  Concentration: Fair and Attention Span: Fair  Recall:  Fair  Fund of Knowledge:Good  Language: Good  Akathisia:  No  Handed:    AIMS (if indicated):  not done  Assets:  Desire for Improvement Housing Physical Health Social Support  ADL's:  Intact  Cognition: WNL  Sleep:  Fair   Screenings: PHQ2-9    Flowsheet Row Video Visit from 11/16/2020 in BE[facility]AV[facility]AL [facility] C[facility] AT [place]  PHQ-2 Total Score 2  PHQ-9 Total Score 12      Flowsheet Row Video Visit from 08/26/2021 in BEHAVIORAL HEALTH OUTPATIENT CENTER AT [place] Video Visit from 05/20/2021 in BEHAVIORAL HEALTH OUTPATIENT CENTER AT [place] Video Visit from 02/08/2021 in  BEHAVIORAL HEALTH OUTPATIENT CENTER AT [place]  C-SSRS RISK CATEGORY No Risk No Risk No Risk       Assessment and Plan: as follows  Prior documentation reviewed   Bipolar disorder current episode depressed;  Somewhat subdued at times, increase lamictal to 100mg  says can get edgy at times Continue latuda Generalized anxiety disorder; when gets stress has to take klonopine, will continue low dose, conisder therapy   Borderline personality traits; manageable, mood stabilizers keeping balance, will increase lamictal to 100mg   No rash Fu 2 plus months    , MD 5/22/20231:41 PM

## 2021-11-07 ENCOUNTER — Telehealth (HOSPITAL_COMMUNITY): Payer: Self-pay

## 2021-11-07 NOTE — Telephone Encounter (Signed)
Prior Approval done for Lurasidone 20mg  tabs Approved until 06/08/22 by [place] by fax

## 2022-01-10 ENCOUNTER — Telehealth (HOSPITAL_COMMUNITY): Payer: Medicare HMO | Admitting: Psychiatry

## 2022-01-22 ENCOUNTER — Telehealth (HOSPITAL_COMMUNITY): Payer: Self-pay

## 2022-01-22 NOTE — Telephone Encounter (Signed)
Medication management - telephone call with patient to inform Dr. Gilmore Laroche sent in a Lamictal 100 mg, one a day order, #90 for [facility] to [facility].  Verified patient taking as ordered and assisted pt with rescheduling next appt for 02/20/22.  Informed patient provider would need to see her for any further refills and patient agreed with virtual visit time.

## 2022-01-22 NOTE — Telephone Encounter (Signed)
Medication refill - Fax from [facility]'s [facility] on [address] requesting a new 90 day Lamotrigine order, last provided 10/28/21. Patient missed appointment 01/10/22 and has not rescheduled to date.

## 2022-01-26 ENCOUNTER — Other Ambulatory Visit (HOSPITAL_COMMUNITY): Payer: Self-pay | Admitting: Psychiatry

## 2022-02-20 ENCOUNTER — Encounter (HOSPITAL_COMMUNITY): Payer: Self-pay

## 2022-02-20 ENCOUNTER — Telehealth (HOSPITAL_COMMUNITY): Payer: Self-pay

## 2022-02-20 ENCOUNTER — Telehealth (HOSPITAL_COMMUNITY): Payer: Medicare HMO | Admitting: Psychiatry

## 2022-02-20 MED ORDER — LURASIDONE HCL 20 MG PO TABS
20.0000 mg | ORAL_TABLET | Freq: Every day | ORAL | 0 refills | Status: DC
Start: 2022-02-20 — End: 2022-03-18

## 2022-02-20 MED ORDER — CLONAZEPAM 0.5 MG PO TABS: ORAL_TABLET | ORAL | 0 refills | Status: DC

## 2022-02-20 NOTE — Telephone Encounter (Signed)
I canceled patients' appt for today. She didn't realize that her son had a dentist appt around the same time as her appt with you today and she didn't know what to do. She resch for 09/27. She needs refills on Latuda and [facility] [address] [facility] [address] [facility] [place]

## 2022-02-20 NOTE — Telephone Encounter (Signed)
[facility] [address] [facility] [address] and you forgot to send [place]. I added pharmacy and called to cancel order at other pharmacy.

## 2022-02-20 NOTE — Addendum Note (Signed)
Addended by: Thresa Ross on: 02/20/2022 01:31 PM   Modules accepted: Orders

## 2022-03-05 ENCOUNTER — Encounter (HOSPITAL_COMMUNITY): Payer: Self-pay | Admitting: Psychiatry

## 2022-03-05 ENCOUNTER — Telehealth (INDEPENDENT_AMBULATORY_CARE_PROVIDER_SITE_OTHER): Payer: Medicare HMO | Admitting: Psychiatry

## 2022-03-05 ENCOUNTER — Telehealth (HOSPITAL_COMMUNITY): Payer: Self-pay

## 2022-03-05 DIAGNOSIS — F3130 Bipolar disorder, current episode depressed, mild or moderate severity, unspecified: Secondary | ICD-10-CM | POA: Diagnosis not present

## 2022-03-05 DIAGNOSIS — Z8659 Personal history of other mental and behavioral disorders: Secondary | ICD-10-CM | POA: Diagnosis not present

## 2022-03-05 DIAGNOSIS — F411 Generalized anxiety disorder: Secondary | ICD-10-CM | POA: Diagnosis not present

## 2022-03-05 MED ORDER — CLONAZEPAM 0.5 MG PO TABS: ORAL_TABLET | ORAL | 0 refills | Status: DC

## 2022-03-05 MED ORDER — LURASIDONE HCL 40 MG PO TABS: 40.0000 mg | ORAL_TABLET | Freq: Every day | ORAL | 0 refills | Status: DC

## 2022-03-05 MED ORDER — CLONAZEPAM 0.5 MG PO TABS
ORAL_TABLET | ORAL | 0 refills | Status: DC
Start: 2022-03-05 — End: 2022-03-18

## 2022-03-05 NOTE — Progress Notes (Signed)
Lori Hahn Follow up visit  Patient Identification: Lori Hahn MRN:  [id number] Date of Evaluation:  03/05/2022 Referral Source: primary care Chief Complaint:  follow up  mood symptoms  Visit Diagnosis:    ICD-10-CM   1. Bipolar I disorder, most recent episode depressed ([place])  F31.30     2. GAD (generalized anxiety disorder)  F41.1     3. H/O borderline personality disorder  Z86.59      Virtual Visit via Video Note  I connected with Lori Hahn on 03/05/22 at 11:00 AM EDT by a video enabled telemedicine application and verified that I am speaking with the correct person using two identifiers.  Location: Patient: home  Provider: home office   I discussed the limitations of evaluation and management by telemedicine and the availability of in person appointments. The patient expressed understanding and agreed to proceed.      I discussed the assessment and treatment plan with the patient. The patient was provided an opportunity to ask questions and all were answered. The patient agreed with the plan and demonstrated an understanding of the instructions.   The patient was advised to call back or seek an in-person evaluation if the symptoms worsen or if the condition fails to improve as anticipated.  I provided 25 minutes of non-face-to-face time during this encounter, including chart review, med adjustment and documentation Video was changed to audio middle session as video was giving interruptions  History of Present Illness: Patient is a 41 years old currently single African-American female who has 4 kids.  Currently she is on disability for bipolar and borderline personality traits as stated by her.  She has seen a psychiatrist at [facility] in the past was not satisfied with services at [place] seen 6 month ago was supposed to fu in 80m Prsents today with anxiety and feeling down, overwhelmed stated depressed but does not want to harm herself Has support from kids dad but  feels low Have recommended therapy but she has not shceduled  Wants to increase her anxiety med despite we discussed that depression need to be addressed   No rash on lamictal  Has not increased latuda from 20mg  to 40mg  as suggested last visit either  Aggravating factors; harsh adapted mom, Dad's death of kidney failure last.  Grief,  Modifying factors; kids, Bf  Severity :feeling down  Past medication use has been Seroquel, Paxil, Geodon, Abilify  Medication that has helped [place], Lamictal, Klonopin   Past Psychiatric History: borderline personality , bipolar disorder  Previous Psychotropic Medications: Yes   Substance Abuse History in the last 12 months:  Yes.    Consequences of Substance Abuse: Discussed effects of alcohol on medications, judjement, depression  Past Medical History:  Past Medical History:  Diagnosis Date   Abnormal Pap smear of cervix    has colpo   Anemia    Depression     Past Surgical History:  Procedure Laterality Date   COLPOSCOPY  2001   dysplasia   TONSILLECTOMY     UMBILICAL HERNIA REPAIR  07/2012    Family Psychiatric History: adapted   Family History:  Family History  Adopted: Yes    Social History:   Social History   Socioeconomic History   Marital status: Single    Spouse name: Not on file   Number of children: Not on file   Years of education: Not on file   Highest education level: Not on file  Occupational History   Not on file  Tobacco Use   Smoking status: Former    Types: Cigarettes    Quit date: 06/16/2013    Years since quitting: 8.7   Smokeless tobacco: Never  Substance and Sexual Activity   Alcohol use: Yes    Comment: occ   Drug use: No   Sexual activity: Yes    Birth control/protection: None, Condom  Other Topics Concern   Not on file  Social History Narrative   Not on file   Social Determinants of Health   Financial Resource Strain: Not on file  Food Insecurity: Not on file  Transportation  Needs: Not on file  Physical Activity: Not on file  Stress: Not on file  Social Connections: Not on file     Allergies:  No Known Allergies  Metabolic Disorder Labs: Lab Results  Component Value Date   HGBA1C 5.6 01/16/2015   No results found for: "PROLACTIN" No results found for: "CHOL", "TRIG", "HDL", "CHOLHDL", "VLDL", "LDLCALC" Lab Results  Component Value Date   TSH 1.320 01/16/2015    Therapeutic Level Labs: No results found for: "LITHIUM" No results found for: "CBMZ" No results found for: "VALPROATE"  Current Medications: Current Outpatient Medications  Medication Sig Dispense Refill   lurasidone (LATUDA) 40 MG TABS tablet Take 1 tablet (40 mg total) by mouth daily with breakfast. Dose increased to 52m 30 tablet 0   clonazePAM (KLONOPIN) 0.5 MG tablet Take one a day 0.5 tablet 0   fluconazole (DIFLUCAN) 150 MG tablet Take one tablet at the onset of symptoms. If symptoms are still present 3 days later, take the second tablet. 2 tablet 0   lamoTRIgine (LAMICTAL) 100 MG tablet Take 1 tablet (100 mg total) by mouth daily. 90 tablet 0   lurasidone (LATUDA) 20 MG TABS tablet Take 1 tablet (20 mg total) by mouth daily. Take one a day for 4 days and then 2  a day. Total dose is 40mg  30 tablet 0   meloxicam (MOBIC) 15 MG tablet Take 1 tablet (15 mg total) by mouth daily. 30 tablet 0   naproxen (NAPROSYN) 500 MG tablet Take 1 tablet (500 mg total) by mouth 2 (two) times daily. 30 tablet 0   trimethoprim-polymyxin b (POLYTRIM) ophthalmic solution Place 2 drops into the right ear every 4 (four) hours. 10 mL 0   No current facility-administered medications for this visit.    Psychiatric Specialty Exam: Review of Systems  Cardiovascular:  Negative for chest pain and palpitations.  Neurological:  Negative for tremors.  Psychiatric/Behavioral:  Positive for dysphoric mood. Negative for self-injury.     unknown if currently breastfeeding.There is no height or weight on file to  calculate BMI.  General Appearance: Casual  Eye Contact:  Good  Speech:  Normal Rate  Volume:   normal  Mood:  subdued  Affect:  Appropriate   Thought Process:  Goal Directed  Orientation:  Full (Time, Place, and Person)  Thought Content:  Rumination  Suicidal Thoughts:  No  Homicidal Thoughts:  No  Memory:  Immediate;   Fair Recent;   Fair  Judgement:  Fair  Insight:  Shallow  Psychomotor Activity:  Normal  Concentration:  Concentration: Fair and Attention Span: Fair  Recall:  of Knowledge:Good  Language: Good  Akathisia:  No  Handed:    AIMS (if indicated):  not done  Assets:  Desire for Improvement Housing Physical Health Social Support  ADL's:  Intact  Cognition: WNL  Sleep:  Fair   Screenings: PHQ2-9  Flowsheet Row Video Visit from 11/16/2020 in BEHAVIORAL HEALTH OUTPATIENT CENTER AT [place]  PHQ-2 Total Score 2  PHQ-9 Total Score 12      Flowsheet Row Video Visit from 03/05/2022 in BEHAVIORAL HEALTH OUTPATIENT CENTER AT [place] Video Visit from 08/26/2021 in BEHAVIORAL HEALTH OUTPATIENT CENTER AT [place] Video Visit from 05/20/2021 in BEHAVIORAL HEALTH OUTPATIENT CENTER AT [place]  C-SSRS RISK CATEGORY No Risk No Risk No Risk       Assessment and Plan: as follows  Prior documentation reviewed   Bipolar disorder current episode depressed;  Subdued, discussed complinace increase latuda to 40mg , she was recommended the same last visit but has kept 20mg  Continue lamictal, strongly suggest therapy, discussed to call office to make appointment and look at other options otherwise to get in therapy to work on coping skills Generalized anxiety disorder; anxious, feel stressed, wants to increase klonopine , discussed risk of being on benzo , will increase to 0.5mg  but to shcedule therapy as well   Borderline personality traits; discussed coping skills, increase latuda and schedule therapy  To follow early within 10- 14 days and to  be regular in her appointments and recommendations  Discussed safety plans and to call 911 or hotline if needed, call office for concerns and therapy appointment be needed    , MD 9/27/202311:45 AM

## 2022-03-05 NOTE — Telephone Encounter (Signed)
Pharmacy sent a fax saying there is no quantity on the rx for Clonazepam. Please advise

## 2022-03-05 NOTE — Telephone Encounter (Signed)
Patient called and says that pharmacy still does not have the quantity for the Clonazepam. Please advise

## 2022-03-05 NOTE — Telephone Encounter (Signed)
Spoke with patient and let her know it was fixed. Nothing further is needed at this time

## 2022-03-18 ENCOUNTER — Encounter (HOSPITAL_COMMUNITY): Payer: Self-pay | Admitting: Psychiatry

## 2022-03-18 ENCOUNTER — Ambulatory Visit (INDEPENDENT_AMBULATORY_CARE_PROVIDER_SITE_OTHER): Payer: Medicare HMO | Admitting: Psychiatry

## 2022-03-18 VITALS — BP 128/80 | HR 90 | Temp 98.6°F | Ht 66.0 in | Wt 246.0 lb

## 2022-03-18 DIAGNOSIS — Z8659 Personal history of other mental and behavioral disorders: Secondary | ICD-10-CM | POA: Diagnosis not present

## 2022-03-18 DIAGNOSIS — F411 Generalized anxiety disorder: Secondary | ICD-10-CM

## 2022-03-18 DIAGNOSIS — F3130 Bipolar disorder, current episode depressed, mild or moderate severity, unspecified: Secondary | ICD-10-CM

## 2022-03-18 MED ORDER — LAMOTRIGINE 100 MG PO TABS: 100.0000 mg | ORAL_TABLET | Freq: Every day | ORAL | 0 refills | Status: DC

## 2022-03-18 MED ORDER — LURASIDONE HCL 40 MG PO TABS: 40.0000 mg | ORAL_TABLET | Freq: Every day | ORAL | 0 refills | Status: DC

## 2022-03-18 MED ORDER — CLONAZEPAM 0.5 MG PO TABS: ORAL_TABLET | ORAL | 0 refills | Status: DC

## 2022-03-18 NOTE — Progress Notes (Signed)
[facility] Follow up visit  Patient Identification: Lori Hahn MRN:  [id number] Date of Evaluation:  03/18/2022 Referral Source: primary care Chief Complaint:  follow up  mood symptoms  Visit Diagnosis:    ICD-10-CM   1. Bipolar I disorder, most recent episode depressed ([place])  F31.30     2. GAD (generalized anxiety disorder)  F41.1     3. H/O borderline personality disorder  Z86.59          History of Present Illness: Patient is a 41 years old currently single African-American female who has 4 kids.  Currently she is on disability for bipolar and borderline personality traits as stated by her.  She has seen a psychiatrist at [facility] in the past was not satisfied with services at [place] visit was overwhelemed and anxious, recommended therapy and added regular klonopine some improved Feels ok but can have a bad day,  Not over whelmed as before and kids father is helpful  Some sleep disturbance  Has been now taking latuda 40mg  ,  No tremors No rash on lamictal   Aggravating factors; harsh adapted mom, Dad's death of kidney failure last. , grief  Modifying factors; kids, BF  Severity some better  Past medication use has been Seroquel, Paxil, Geodon, Abilify  Medication that has helped Lori Hahn, Lamictal, Klonopin   Past Psychiatric History: borderline personality , bipolar disorder  Previous Psychotropic Medications: Yes   Substance Abuse History in the last 12 months:  Yes.    Consequences of Substance Abuse: Discussed effects of alcohol on medications, judjement, depression  Past Medical History:  Past Medical History:  Diagnosis Date   Abnormal Pap smear of cervix    has colpo   Anemia    Depression     Past Surgical History:  Procedure Laterality Date   COLPOSCOPY  2001   dysplasia   TONSILLECTOMY     UMBILICAL HERNIA REPAIR  07/2012    Family Psychiatric History: adapted   Family History:  Family History  Adopted: Yes    Social History:    Social History   Socioeconomic History   Marital status: Single    Spouse name: Not on file   Number of children: Not on file   Years of education: Not on file   Highest education level: Not on file  Occupational History   Not on file  Tobacco Use   Smoking status: Former    Types: Cigarettes    Quit date: 06/16/2013    Years since quitting: 8.7   Smokeless tobacco: Never  Substance and Sexual Activity   Alcohol use: Yes    Comment: occ   Drug use: No   Sexual activity: Yes    Birth control/protection: None, Condom  Other Topics Concern   Not on file  Social History Narrative   Not on file   Social Determinants of Health   Financial Resource Strain: Not on file  Food Insecurity: Not on file  Transportation Needs: Not on file  Physical Activity: Not on file  Stress: Not on file  Social Connections: Not on file     Allergies:  No Known Allergies  Metabolic Disorder Labs: Lab Results  Component Value Date   HGBA1C 5.6 01/16/2015   No results found for: "PROLACTIN" No results found for: "CHOL", "TRIG", "HDL", "CHOLHDL", "VLDL", "LDLCALC" Lab Results  Component Value Date   TSH 1.320 01/16/2015    Therapeutic Level Labs: No results found for: "LITHIUM" No results found for: "CBMZ" No results  found for: "VALPROATE"  Current Medications: Current Outpatient Medications  Medication Sig Dispense Refill   clonazePAM (KLONOPIN) 0.5 MG tablet Take one a day 30 tablet 0   fluconazole (DIFLUCAN) 150 MG tablet Take one tablet at the onset of symptoms. If symptoms are still present 3 days later, take the second tablet. 2 tablet 0   lamoTRIgine (LAMICTAL) 100 MG tablet Take 1 tablet (100 mg total) by mouth daily. 90 tablet 0   lurasidone (LATUDA) 40 MG TABS tablet Take 1 tablet (40 mg total) by mouth daily with breakfast. Dose increased to 17m 30 tablet 0   meloxicam (MOBIC) 15 MG tablet Take 1 tablet (15 mg total) by mouth daily. 30 tablet 0   naproxen (NAPROSYN) 500  MG tablet Take 1 tablet (500 mg total) by mouth 2 (two) times daily. 30 tablet 0   trimethoprim-polymyxin b (POLYTRIM) ophthalmic solution Place 2 drops into the right ear every 4 (four) hours. 10 mL 0   No current facility-administered medications for this visit.    Psychiatric Specialty Exam: Review of Systems  Cardiovascular:  Negative for chest pain and palpitations.  Psychiatric/Behavioral:  Negative for self-injury.     Blood pressure 128/80, pulse 90, temperature 98.6 F (37 C), height 5\' 6"  (1.676 m), weight 246 lb (111.6 kg), SpO2 96 %, unknown if currently breastfeeding.Body mass index is 39.71 kg/m.  General Appearance: Casual  Eye Contact:  Good  Speech:  Normal Rate  Volume:   normal  Mood:  some better  Affect:  Appropriate   Thought Process:  Goal Directed  Orientation:  Full (Time, Place, and Person)  Thought Content:  Rumination  Suicidal Thoughts:  No  Homicidal Thoughts:  No  Memory:  Immediate;   Fair Recent;   Fair  Judgement:  Fair  Insight:  Shallow  Psychomotor Activity:  Normal  Concentration:  Concentration: Fair and Attention Span: Fair  Recall:  of Knowledge:Good  Language: Good  Akathisia:  No  Handed:    AIMS (if indicated):  not done  Assets:  Desire for Improvement Housing Physical Health Social Support  ADL's:  Intact  Cognition: WNL  Sleep:  Fair   Screenings: PHQ2-9    Flowsheet Row Video Visit from 11/16/2020 in BEHAVIORAL HEALTH OUTPATIENT CENTER AT [place]  PHQ-2 Total Score 2  PHQ-9 Total Score 12      Flowsheet Row Office Visit from 03/18/2022 in BEHAVIORAL HEALTH OUTPATIENT CENTER AT [place] Video Visit from 03/05/2022 in BEHAVIORAL HEALTH OUTPATIENT CENTER AT [place] Video Visit from 08/26/2021 in BEHAVIORAL HEALTH OUTPATIENT CENTER AT [place]  C-SSRS RISK CATEGORY No Risk No Risk No Risk       Assessment and Plan: as follows  Prior documentation reviewed   Bipolar disorder current  episode depressed;  Somewhat subdued but better , does not want to increase meds, continue latuda 40mg , lamictal 100mg , no  Rash, has not scheduled therapy yet   Generalized anxiety disorder; some better , takes prn klonopine Can take half of trazadone for sleep if needed  Borderline personality traits; : gets overwhelmed, discussed to start therapy continue work on distractions Refills due were sent  Discussed safety plans and to call 911 or hotline if needed, call office for concerns and therapy appointment be needed  Fu 4 -6 weeks  08/28/2021, MD 10/10/20232:44 PM

## 2022-03-26 ENCOUNTER — Ambulatory Visit (INDEPENDENT_AMBULATORY_CARE_PROVIDER_SITE_OTHER): Payer: Medicare HMO | Admitting: Licensed Clinical Social Worker

## 2022-03-26 DIAGNOSIS — F411 Generalized anxiety disorder: Secondary | ICD-10-CM

## 2022-03-26 DIAGNOSIS — F3130 Bipolar disorder, current episode depressed, mild or moderate severity, unspecified: Secondary | ICD-10-CM | POA: Diagnosis not present
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
## 2022-04-29 ENCOUNTER — Telehealth (HOSPITAL_COMMUNITY): Payer: Medicare HMO | Admitting: Psychiatry

## 2022-04-29 ENCOUNTER — Encounter (HOSPITAL_COMMUNITY): Payer: Self-pay | Admitting: Psychiatry

## 2022-12-28 ENCOUNTER — Ambulatory Visit (HOSPITAL_COMMUNITY)
Admission: EM | Admit: 2022-12-28 | Discharge: 2022-12-28 | Disposition: A | Discharge status: Home / Self Care | Payer: Medicare HMO

## 2022-12-28 ENCOUNTER — Encounter (HOSPITAL_COMMUNITY): Payer: Self-pay | Admitting: Emergency Medicine

## 2022-12-28 DIAGNOSIS — S39012A Strain of muscle, fascia and tendon of lower back, initial encounter: Secondary | ICD-10-CM | POA: Diagnosis not present

## 2022-12-28 DIAGNOSIS — M25511 Pain in right shoulder: Secondary | ICD-10-CM | POA: Diagnosis not present

## 2022-12-28 MED ORDER — METHOCARBAMOL 500 MG PO TABS: 500.0000 mg | ORAL_TABLET | Freq: Two times a day (BID) | ORAL | 0 refills | Status: AC

## 2022-12-28 MED ORDER — KETOROLAC TROMETHAMINE 30 MG/ML IJ SOLN
30.0000 mg | Freq: Once | INTRAMUSCULAR | Status: AC
  Administered 2022-12-28: 30 mg via INTRAMUSCULAR

## 2022-12-28 MED ORDER — KETOROLAC TROMETHAMINE 30 MG/ML IJ SOLN
INTRAMUSCULAR | Status: AC
  Filled 2022-12-28: qty 1
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
## 2023-03-15 ENCOUNTER — Emergency Department (HOSPITAL_COMMUNITY)
Admission: EM | Admit: 2023-03-15 | Discharge: 2023-03-15 | Disposition: A | Discharge status: Home / Self Care | Payer: Medicare HMO | Attending: Emergency Medicine | Admitting: Emergency Medicine

## 2023-03-15 ENCOUNTER — Other Ambulatory Visit: Payer: Self-pay

## 2023-03-15 ENCOUNTER — Encounter (HOSPITAL_COMMUNITY): Payer: Self-pay

## 2023-03-15 DIAGNOSIS — M25562 Pain in left knee: Secondary | ICD-10-CM | POA: Diagnosis present

## 2023-03-15 MED ORDER — CYCLOBENZAPRINE HCL 10 MG PO TABS: 10.0000 mg | ORAL_TABLET | Freq: Two times a day (BID) | ORAL | 0 refills | Status: AC | PRN

## 2023-03-15 MED ORDER — KETOROLAC TROMETHAMINE 10 MG PO TABS: 10.0000 mg | ORAL_TABLET | Freq: Three times a day (TID) | ORAL | 0 refills | Status: AC | PRN

## 2023-03-15 MED ORDER — KETOROLAC TROMETHAMINE 15 MG/ML IJ SOLN
15.0000 mg | Freq: Once | INTRAMUSCULAR | Status: AC
  Administered 2023-03-15: 15 mg via INTRAMUSCULAR
  Filled 2023-03-15: qty 1
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
## 2023-03-15 NOTE — Discharge Instructions (Addendum)
You have been seen today for your complaint of left knee pain. Your discharge medications include Toradol.  This is an NSAID.  Take it only as needed. Flexeril. This is a muscle relaxer. It may cause drowsiness. Do not drive, operate heavy machinery or make important decisions when taking this medication. Only take it at night until you know how it affects you. Only take it as needed and take other medications such as ibuprofen or tylenol prior to trying this medication. Follow up with: Dr. Dion Saucier.  He is an Investment banker, operational.  Call to schedule an appointment for follow-up visit. Please seek immediate medical care if you develop any of the following symptoms: Your knee swells, and the swelling gets worse. You cannot move your knee. You have very bad knee pain that does not get better with pain medicine. At this time there does not appear to be the presence of an emergent medical condition, however there is always the potential for conditions to change. Please read and follow the below instructions.  Do not take your medicine if  develop an itchy rash, swelling in your mouth or lips, or difficulty breathing; call 911 and seek immediate emergency medical attention if this occurs.  You may review your lab tests and imaging results in their entirety on your MyChart account.  Please discuss all results of fully with your primary care provider and other specialist at your follow-up visit.  Note: Portions of this text may have been transcribed using voice recognition software. Every effort was made to ensure accuracy; however, inadvertent computerized transcription errors may still be present.

## 2023-05-11 ENCOUNTER — Ambulatory Visit: Payer: Medicare HMO | Admitting: Family Medicine

## 2023-12-22 ENCOUNTER — Other Ambulatory Visit: Payer: Self-pay | Admitting: Adult Health Nurse Practitioner

## 2023-12-22 ENCOUNTER — Institutional Professional Consult (permissible substitution): Admitting: Family Medicine

## 2023-12-22 DIAGNOSIS — Z9189 Other specified personal risk factors, not elsewhere classified: Secondary | ICD-10-CM

## 2024-01-12 ENCOUNTER — Ambulatory Visit: Admitting: Family Medicine

## 2024-01-15 ENCOUNTER — Other Ambulatory Visit

## 2024-02-19 ENCOUNTER — Other Ambulatory Visit: Payer: Self-pay | Admitting: Adult Health Nurse Practitioner

## 2024-02-19 DIAGNOSIS — N6489 Other specified disorders of breast: Secondary | ICD-10-CM

## 2024-02-19 DIAGNOSIS — R928 Other abnormal and inconclusive findings on diagnostic imaging of breast: Secondary | ICD-10-CM

## 2024-02-23 ENCOUNTER — Ambulatory Visit

## 2024-02-23 ENCOUNTER — Ambulatory Visit
Admission: RE | Admit: 2024-02-23 | Discharge: 2024-02-23 | Disposition: A | Discharge status: Home / Self Care | Source: Ambulatory Visit | Attending: Adult Health Nurse Practitioner | Admitting: Adult Health Nurse Practitioner

## 2024-02-23 DIAGNOSIS — N6489 Other specified disorders of breast: Secondary | ICD-10-CM

## 2024-02-23 DIAGNOSIS — R928 Other abnormal and inconclusive findings on diagnostic imaging of breast: Secondary | ICD-10-CM
